# Patient Record
Sex: Female | Born: 1965
Health system: Southern US, Community
[De-identification: ages and names within clinical notes are randomized; demographics above are authoritative.]

## PROBLEM LIST (undated history)

## (undated) DIAGNOSIS — M549 Dorsalgia, unspecified: Secondary | ICD-10-CM

## (undated) DIAGNOSIS — Z87898 Personal history of other specified conditions: Secondary | ICD-10-CM

## (undated) DIAGNOSIS — K048 Radicular cyst: Secondary | ICD-10-CM

## (undated) DIAGNOSIS — G629 Polyneuropathy, unspecified: Secondary | ICD-10-CM

## (undated) DIAGNOSIS — K219 Gastro-esophageal reflux disease without esophagitis: Secondary | ICD-10-CM

## (undated) DIAGNOSIS — R112 Nausea with vomiting, unspecified: Secondary | ICD-10-CM

## (undated) DIAGNOSIS — M1990 Unspecified osteoarthritis, unspecified site: Secondary | ICD-10-CM

## (undated) DIAGNOSIS — J302 Other seasonal allergic rhinitis: Secondary | ICD-10-CM

## (undated) DIAGNOSIS — E119 Type 2 diabetes mellitus without complications: Secondary | ICD-10-CM

## (undated) DIAGNOSIS — M5030 Other cervical disc degeneration, unspecified cervical region: Secondary | ICD-10-CM

## (undated) DIAGNOSIS — Z9889 Other specified postprocedural states: Secondary | ICD-10-CM

## (undated) DIAGNOSIS — E782 Mixed hyperlipidemia: Secondary | ICD-10-CM

## (undated) DIAGNOSIS — Z973 Presence of spectacles and contact lenses: Secondary | ICD-10-CM

## (undated) DIAGNOSIS — N200 Calculus of kidney: Secondary | ICD-10-CM

## (undated) DIAGNOSIS — D509 Iron deficiency anemia, unspecified: Secondary | ICD-10-CM

## (undated) DIAGNOSIS — I10 Essential (primary) hypertension: Secondary | ICD-10-CM

## (undated) DIAGNOSIS — N879 Dysplasia of cervix uteri, unspecified: Secondary | ICD-10-CM

## (undated) DIAGNOSIS — F909 Attention-deficit hyperactivity disorder, unspecified type: Secondary | ICD-10-CM

## (undated) DIAGNOSIS — G8929 Other chronic pain: Secondary | ICD-10-CM

## (undated) HISTORY — DX: Other cervical disc degeneration, unspecified cervical region: M50.30

## (undated) HISTORY — PX: KNEE SURGERY: SHX244

## (undated) HISTORY — DX: Dorsalgia, unspecified: M54.9

## (undated) HISTORY — PX: TRANSTHORACIC ECHOCARDIOGRAM: SHX275

## (undated) HISTORY — PX: CARDIOVASCULAR STRESS TEST: SHX262

## (undated) HISTORY — DX: Polyneuropathy, unspecified: G62.9

## (undated) HISTORY — DX: Other chronic pain: G89.29

## (undated) HISTORY — DX: Attention-deficit hyperactivity disorder, unspecified type: F90.9

## (undated) HISTORY — DX: Radicular cyst: K04.8

---
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
## 1998-05-06 HISTORY — PX: LAPAROSCOPIC CHOLECYSTECTOMY: SUR755

## 1998-08-16 ENCOUNTER — Emergency Department (HOSPITAL_COMMUNITY): Admission: EM | Admit: 1998-08-16 | Discharge: 1998-08-16 | Payer: Self-pay | Admitting: Emergency Medicine

## 1998-08-29 ENCOUNTER — Encounter: Payer: Self-pay | Admitting: Internal Medicine

## 1998-08-29 ENCOUNTER — Ambulatory Visit (HOSPITAL_COMMUNITY): Admission: RE | Admit: 1998-08-29 | Discharge: 1998-08-29 | Payer: Self-pay | Admitting: Internal Medicine

## 1999-05-21 ENCOUNTER — Encounter: Admission: RE | Admit: 1999-05-21 | Discharge: 1999-08-19 | Payer: Self-pay | Admitting: Endocrinology

## 1999-10-05 ENCOUNTER — Other Ambulatory Visit: Admission: RE | Admit: 1999-10-05 | Discharge: 1999-10-05 | Payer: Self-pay | Admitting: Obstetrics and Gynecology

## 1999-11-19 ENCOUNTER — Encounter: Payer: Self-pay | Admitting: Obstetrics and Gynecology

## 1999-11-19 ENCOUNTER — Ambulatory Visit (HOSPITAL_COMMUNITY): Admission: RE | Admit: 1999-11-19 | Discharge: 1999-11-19 | Payer: Self-pay | Admitting: Obstetrics and Gynecology

## 1999-12-23 ENCOUNTER — Inpatient Hospital Stay (HOSPITAL_COMMUNITY): Admission: AD | Admit: 1999-12-23 | Discharge: 1999-12-23 | Payer: Self-pay | Admitting: Obstetrics and Gynecology

## 2000-01-29 ENCOUNTER — Encounter: Payer: Self-pay | Admitting: Obstetrics & Gynecology

## 2000-01-29 ENCOUNTER — Ambulatory Visit (HOSPITAL_COMMUNITY): Admission: RE | Admit: 2000-01-29 | Discharge: 2000-01-29 | Payer: Self-pay | Admitting: Obstetrics & Gynecology

## 2000-02-28 ENCOUNTER — Ambulatory Visit (HOSPITAL_COMMUNITY): Admission: RE | Admit: 2000-02-28 | Discharge: 2000-02-28 | Payer: Self-pay | Admitting: Obstetrics & Gynecology

## 2000-02-28 ENCOUNTER — Encounter: Payer: Self-pay | Admitting: Obstetrics & Gynecology

## 2000-03-18 ENCOUNTER — Encounter (HOSPITAL_COMMUNITY): Admission: RE | Admit: 2000-03-18 | Discharge: 2000-04-09 | Payer: Self-pay | Admitting: Obstetrics and Gynecology

## 2000-03-28 ENCOUNTER — Encounter: Payer: Self-pay | Admitting: Obstetrics and Gynecology

## 2000-04-08 ENCOUNTER — Observation Stay (HOSPITAL_COMMUNITY): Admission: AD | Admit: 2000-04-08 | Discharge: 2000-04-09 | Payer: Self-pay | Admitting: Obstetrics and Gynecology

## 2000-04-11 ENCOUNTER — Encounter (HOSPITAL_COMMUNITY): Admission: RE | Admit: 2000-04-11 | Discharge: 2000-04-21 | Payer: Self-pay | Admitting: *Deleted

## 2000-04-18 ENCOUNTER — Encounter (INDEPENDENT_AMBULATORY_CARE_PROVIDER_SITE_OTHER): Payer: Self-pay | Admitting: Specialist

## 2000-04-18 ENCOUNTER — Inpatient Hospital Stay (HOSPITAL_COMMUNITY): Admission: AD | Admit: 2000-04-18 | Discharge: 2000-04-21 | Payer: Self-pay | Admitting: Obstetrics and Gynecology

## 2000-04-22 ENCOUNTER — Encounter: Admission: RE | Admit: 2000-04-22 | Discharge: 2000-07-21 | Payer: Self-pay | Admitting: Obstetrics and Gynecology

## 2000-08-21 ENCOUNTER — Encounter: Admission: RE | Admit: 2000-08-21 | Discharge: 2000-09-20 | Payer: Self-pay | Admitting: Obstetrics and Gynecology

## 2000-10-21 ENCOUNTER — Encounter: Admission: RE | Admit: 2000-10-21 | Discharge: 2000-11-20 | Payer: Self-pay | Admitting: Obstetrics and Gynecology

## 2001-05-06 HISTORY — PX: ANKLE FUSION: SHX881

## 2001-07-01 ENCOUNTER — Emergency Department (HOSPITAL_COMMUNITY): Admission: EM | Admit: 2001-07-01 | Discharge: 2001-07-01 | Payer: Self-pay | Admitting: *Deleted

## 2002-05-06 HISTORY — PX: ROTATOR CUFF REPAIR: SHX139

## 2004-09-03 ENCOUNTER — Encounter: Admission: RE | Admit: 2004-09-03 | Discharge: 2004-09-03 | Payer: Self-pay | Admitting: Family Medicine

## 2004-09-06 ENCOUNTER — Encounter: Admission: RE | Admit: 2004-09-06 | Discharge: 2004-09-06 | Payer: Self-pay | Admitting: Family Medicine

## 2004-09-20 ENCOUNTER — Other Ambulatory Visit: Admission: RE | Admit: 2004-09-20 | Discharge: 2004-09-20 | Payer: Self-pay | Admitting: Family Medicine

## 2005-03-12 ENCOUNTER — Encounter: Admission: RE | Admit: 2005-03-12 | Discharge: 2005-03-12 | Payer: Self-pay | Admitting: Family Medicine

## 2006-03-11 ENCOUNTER — Other Ambulatory Visit: Admission: RE | Admit: 2006-03-11 | Discharge: 2006-03-11 | Payer: Self-pay | Admitting: Family Medicine

## 2007-09-27 ENCOUNTER — Emergency Department (HOSPITAL_COMMUNITY): Admission: EM | Admit: 2007-09-27 | Discharge: 2007-09-27 | Payer: Self-pay | Admitting: Emergency Medicine

## 2007-10-20 ENCOUNTER — Encounter: Admission: RE | Admit: 2007-10-20 | Discharge: 2007-10-20 | Payer: Self-pay | Admitting: General Surgery

## 2007-11-21 ENCOUNTER — Emergency Department (HOSPITAL_COMMUNITY): Admission: EM | Admit: 2007-11-21 | Discharge: 2007-11-21 | Payer: Self-pay | Admitting: Emergency Medicine

## 2008-03-04 ENCOUNTER — Emergency Department (HOSPITAL_COMMUNITY): Admission: EM | Admit: 2008-03-04 | Discharge: 2008-03-04 | Payer: Self-pay | Admitting: Emergency Medicine

## 2008-07-14 ENCOUNTER — Ambulatory Visit: Payer: Self-pay | Admitting: Vascular Surgery

## 2008-07-14 ENCOUNTER — Emergency Department (HOSPITAL_COMMUNITY): Admission: EM | Admit: 2008-07-14 | Discharge: 2008-07-14 | Payer: Self-pay | Admitting: Emergency Medicine

## 2008-07-14 ENCOUNTER — Encounter (INDEPENDENT_AMBULATORY_CARE_PROVIDER_SITE_OTHER): Payer: Self-pay | Admitting: Emergency Medicine

## 2008-10-10 IMAGING — CT CT ABDOMEN W/O CM
2 of 4 series · 16 of 46 positions shown, 18 images · non-contrast
Comparison: 09/03/2004

CT ABDOMEN

CLINICAL DATA: Right flank pain.

CT ABDOMEN AND PELVIS WITHOUT CONTRAST
TECHNIQUE: Multidetector CT imaging of the abdomen and pelvis was
performed following the standard
protocol without intravenous contrast.

[Series 2: stone_wo 5.0 b40f st · axial · 0.81mm/px · z∈[-522,-66]mm · 13 of 126 slices shown, 15 images]
[im 6/126  soft-tissue]
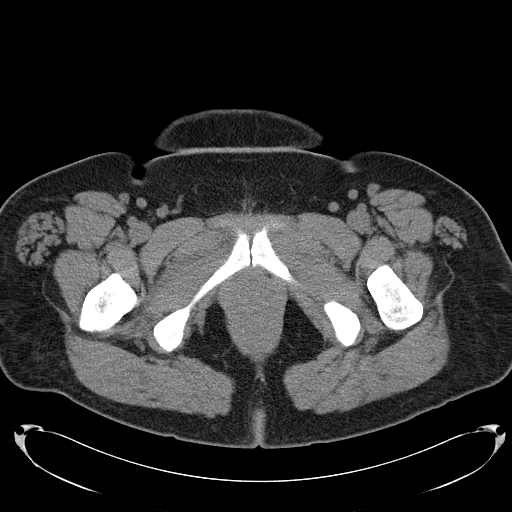
[im 6/126  bone]
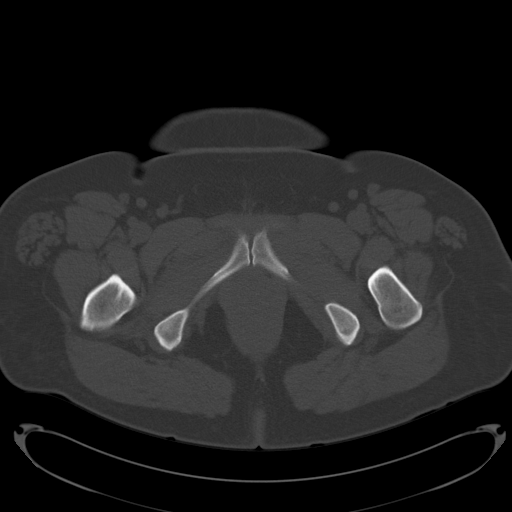
[im 16/126  soft-tissue]
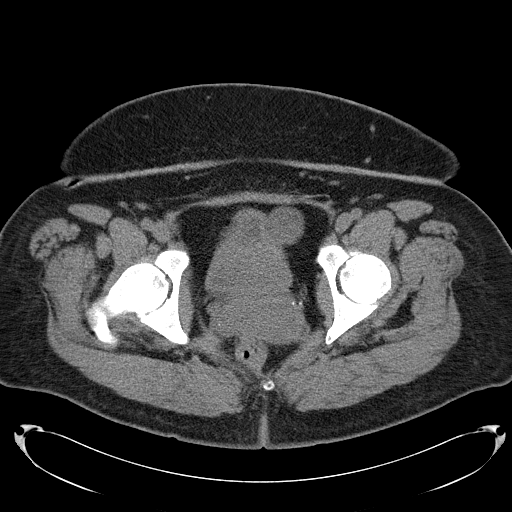
[im 27/126  soft-tissue]
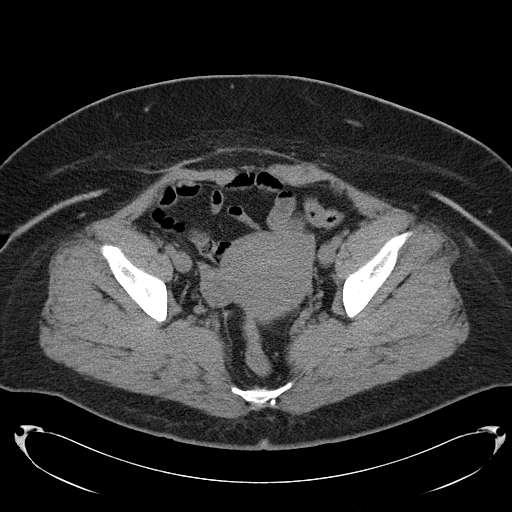
[im 37/126  soft-tissue]
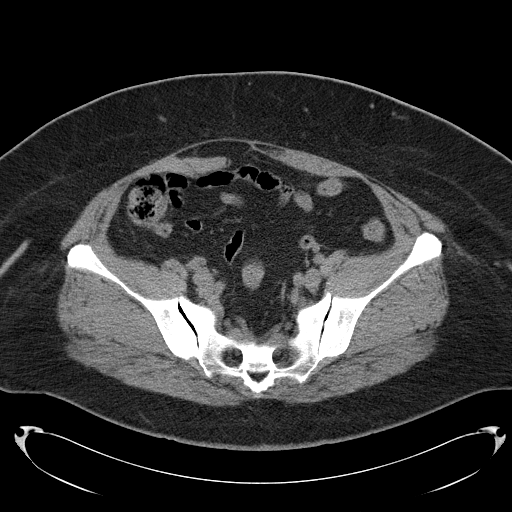
[im 42/126  soft-tissue]
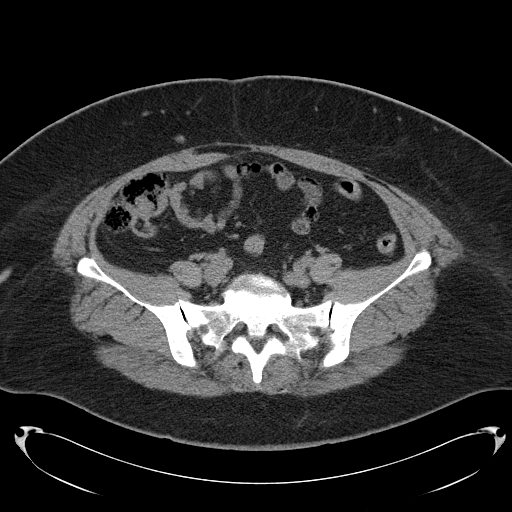
[im 53/126  soft-tissue]
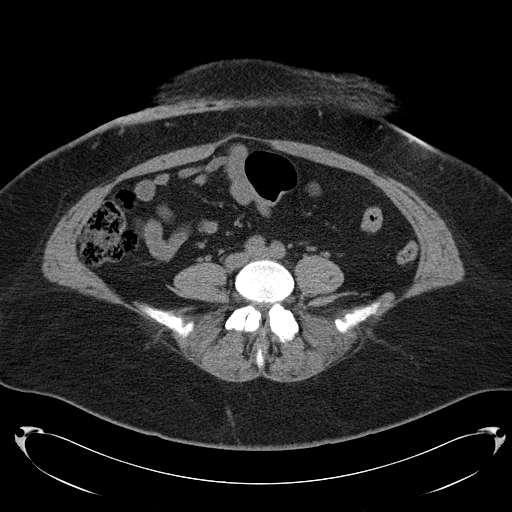
[im 63/126  soft-tissue]
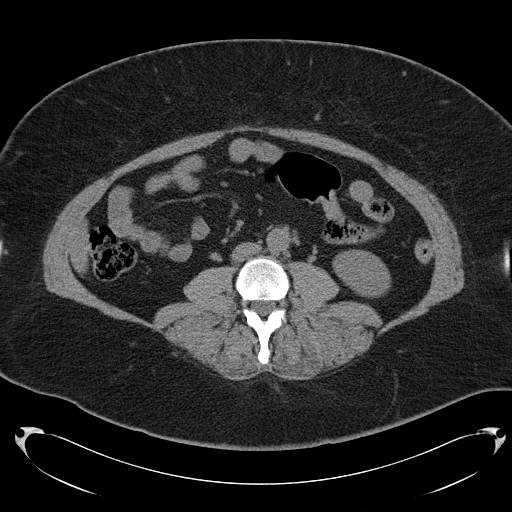
[im 73/126  soft-tissue]
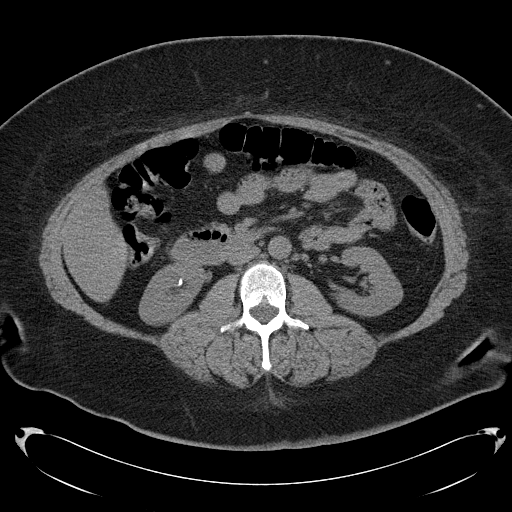
[im 84/126  soft-tissue]
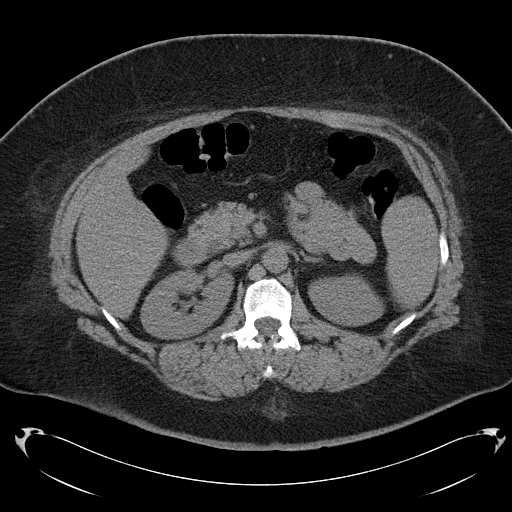
[im 84/126  bone]
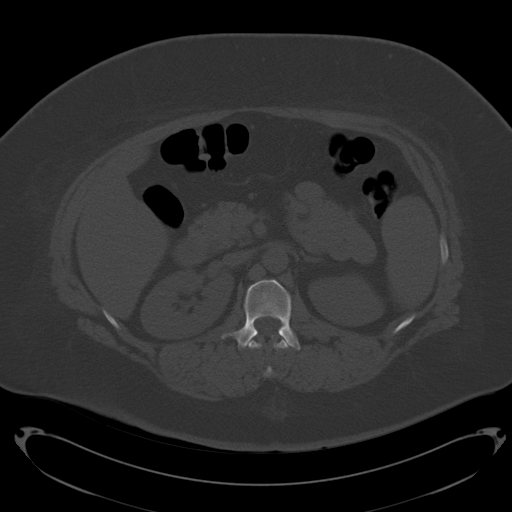
[im 89/126  soft-tissue]
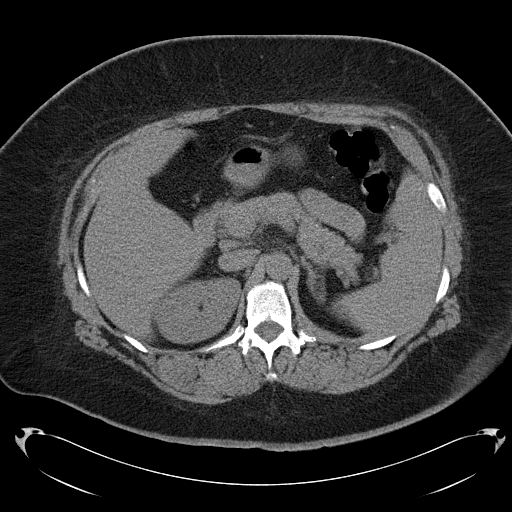
[im 99/126  soft-tissue]
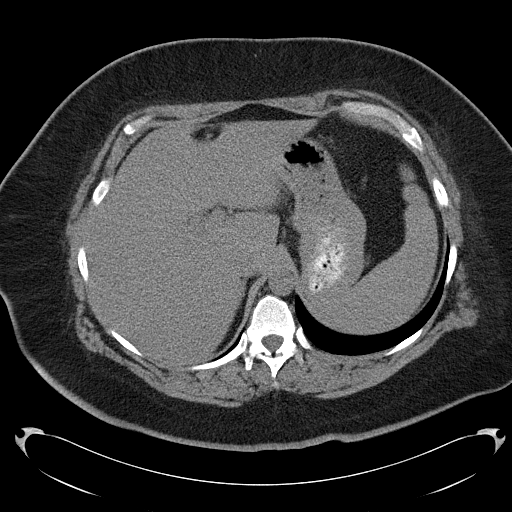
[im 110/126  soft-tissue]
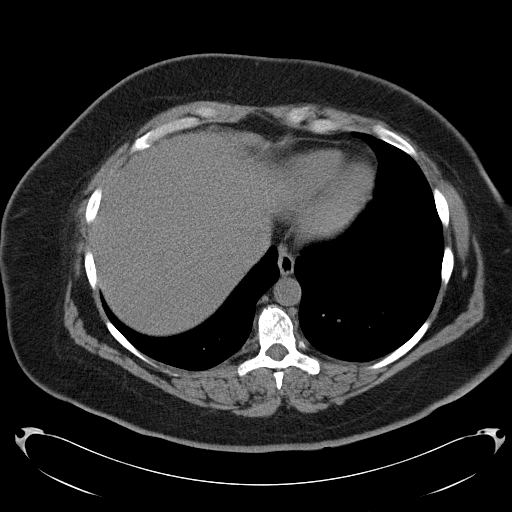
[im 120/126  soft-tissue]
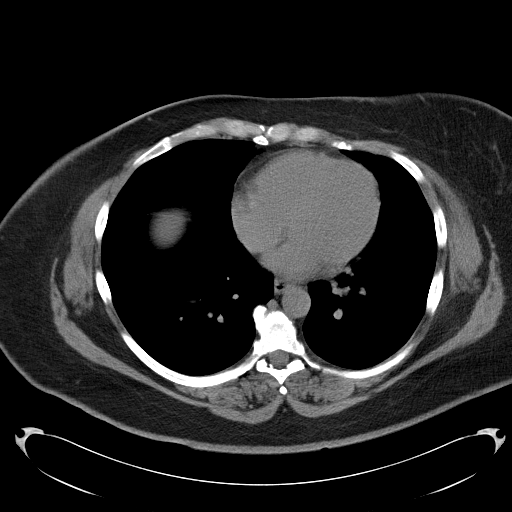

[Series 602: coronal abdomen · coronal · 1.02mm/px · 3 of 120 slices shown]
[im 40/120  soft-tissue]
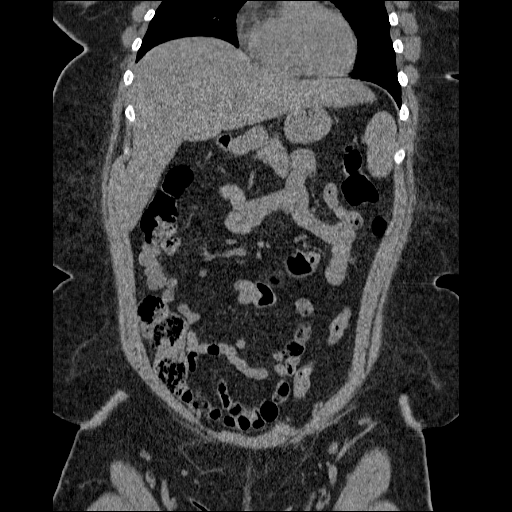
[im 53/120  soft-tissue]
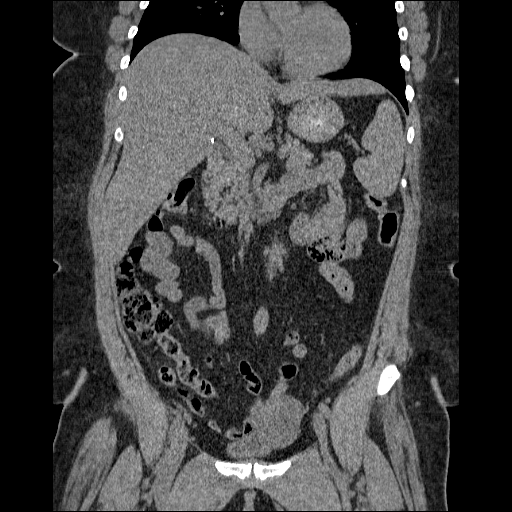
[im 67/120  soft-tissue]
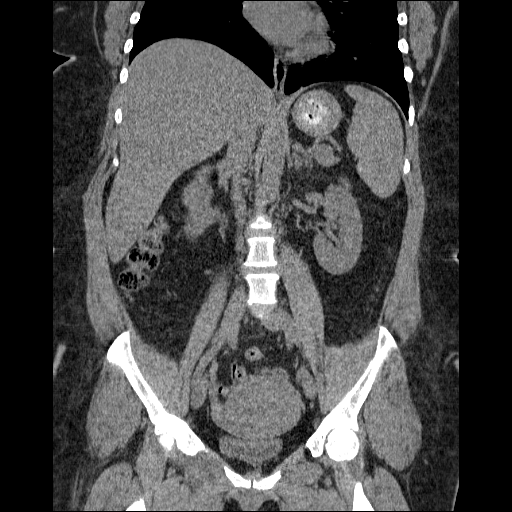

[16 of 46 positions shown; findings below may reference images not displayed]

FINDINGS: Lung bases clear.  Mild lower thoracic spondylosis.
Cholecystectomy.  Noncontrast technique gives limited evaluation of
solid and hollow abdominal viscera.  No left renal stones.
Interpolar to lower polar 2 cm low density exophytic renal lesion
is present, incompletely characterized but likely representing
simple cyst.  Right kidney shows 7 x 3 mm inferior pole
nonobstructing collecting system calculus.  4 mm x 2 mm interpolar
nonobstructing collecting system calculus.  Both ureters appear
within normal limits, without ureteral calculi.  Calcified
phleboliths are noted in the anatomic pelvis. Fatty liver has
improved compared to the prior exam. The left interpolar cystic
lesion with little change from the prior exam, consistent with
benign etiology.
IMPRESSION: 1.  2 Nonobstructing right renal collecting system calculi, with
the larger stone measuring 7 mm x 3 mm in the right inferior pole
collecting system.
2.  Cholecystectomy.
3.  On the prior exam, the interpolar right renal collecting system
stone was smaller, representing a slight increase in stone
activity.

CT PELVIS
FINDINGS: Urinary bladder appears within normal limits.  Left
adnexal cystic lesion is present likely representing left ovarian
cysts measuring 3.7 x 2.6 cm.  The right adnexa appear within
normal limits.  The appendix and colon appear normal.  No free
fluid or free air.  No lymphadenopathy. Lower lumbar spondylosis,
most pronounced at L5-S1. Diastasis of the rectus muscles is
present, with a small fat containing ventral hernia.  Severe
bilateral SI joint degenerative disease.  Benign appearing bone
island present and the medial left iliac bone.
IMPRESSION: No acute pelvic abnormality.  3.7 cm left ovarian cyst.
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
## 2010-09-21 NOTE — Op Note (Signed)
[facility]  Patient:    Miranda Lee, Miranda Lee                    MRN: [id number] Proc. Date: 04/18/00 Adm. Date:  [id number] Attending:  Leonard Schwartz                           Operative Report  PREOPERATIVE DIAGNOSES:       1. Term intrauterine pregnancy.                               2. Insulin-dependent diabetes mellitus.                               3. Desires sterilization.                               4. Suspected macrosomia.  POSTOPERATIVE DIAGNOSES:      1. Term intrauterine pregnancy.                               2. Insulin-dependent diabetes mellitus.                               3. Desires sterilization.                               4. Suspected macrosomia.  PROCEDURE:                    1. Primary low transverse cesarean section.                               2. Modified [place] bilateral tubal ligation.  SURGEON:                      Janine Limbo, M.D.  ASSISTANT:                    Miguel Dibble, C.N.M.  ANESTHESIA:                   Spinal.  INDICATIONS:                  Miranda Lee is a 45 year old female, gravida 3, para 2-0-0-2, who presents at [redacted] weeks gestation for a primary cesarean delivery.  The patient has had progressively larger infants.  An ultrasound was done at 35 weeks that showed that the patient was in the 97th percentile. The patient desires a permanent sterilization.  We discussed the risks and the benefits associated with a vaginal birth.  We also discussed the risks and benefits of a cesarean delivery.  After carefully considering her options, the patient elected to proceed with a primary cesarean delivery.  The patient also desires a permanent sterilization.  She understands the indications for her procedure and she accepts the risks of, but not limited to, anesthetic complications, bleeding, infection, and possible damage to the surrounding organs.  She understands that there is a small  but real failure rate associated with this procedure (  10-17/1000).  FINDINGS:                     A 9 pound, 7 ounce female infant (Abigail), was delivered with Apgars of 8 at one minute and 9 at five minutes.  The fallopian tubes, ovaries, and the uterus were normal.  DESCRIPTION OF PROCEDURE:     The patient was taken to the operating room where a spinal anesthetic was given.  The patients abdomen and perineum were prepped with multiple layers of Betadine.  A Foley catheter was placed.  The patient was then sterilely draped.  A low transverse incision was made in the abdomen and carried sharply through the subcutaneous tissues, the fascia, and the anterior peritoneum.  An incision was made in the lower uterine segment and extended transversely.  The fetal head was delivered.  The mouth and nose were suctioned.  The remainder of the infant was delivered.  The cord was clamped and cut.  The infant was handed to the awaiting pediatric team. Routine cord blood studies were obtained.  The placenta was removed.  The uterine cavity was cleaned of amniotic fluid and clotted blood.  The uterine incision was closed using a running locking suture of #2-0 Vicryl.  Hemostasis was achieved using figure-of-eight sutures of #2-0 Vicryl.  The left fallopian tube was identified and followed to its fimbriated end.  A knuckle of tube was made on the left using a free tie, and then a tie of suture ligature of #0 plain catgut.  The knuckle of tube thus made was sharply excised.  Hemostasis was adequate.  The cut ends of the tube were cauterized.  An identical procedure was carried out on the opposite side.  Again hemostasis was adequate.  The peritoneal cavity was irrigated.  All instruments were removed. The abdominal musculature and the fat and the anterior peritoneum were reapproximated in the midline using a figure-of-eight suture of #2-0 Vicryl. The fascia was irrigated and then the fascia was closed  using a running suture of #0 Vicryl, followed by three interrupted sutures of #0 Vicryl.  The subcutaneous layer was irrigated and hemostasis was adequate.  The subcutaneous layer was closed using figure-of-eight sutures of#2-0 Vicryl. The skin was reapproximated using skin staples.  The sponge, needle, and instrument counts were correct on two occasions.  The estimated blood loss for the procedure was 700 cc.  The patient tolerated her procedure well.  She was awakened from her anesthetic and taken to the recovery room in stable condition.  The infant was taken to the full-term nursing in stable condition. DD:  04/18/00 TD:  04/19/00 Job: [id number]

## 2010-09-21 NOTE — H&P (Signed)
[facility]  Patient:    Miranda Lee, Miranda Lee                    MRN: [id number] Adm. Date:  [id number] Attending:  Michaelle Copas                         History and Physical  No dictation. DD:  04/17/00 TD:  04/18/00 Job: [id number]

## 2010-09-21 NOTE — H&P (Signed)
[facility]  Patient:    Miranda Lee, Miranda Lee                    MRN: [id number] Adm. Date:  [id number] Attending:  Michaelle Copas                         History and Physical  HISTORY OF PRESENT ILLNESS:   Miranda Lee is a 45 year old female, gravida 3, para 2-0-0-2, who presents at [redacted] weeks gestation [facility] May 02, 2000) for cesarean delivery.  The patient has been followed at [facility] for this pregnancy that has been complicated by insulin-requiring diabetes, obesity, history of postpartum depression, and a desire for tubal ligation.  Her blood sugars have been well-controlled.  An ultrasound was obtained on March 18, 2000 at which time, macrosomia was noted.  The infant weighed greater than the 97th percentile.  PAST MEDICAL HISTORY:         The patient has diabetes and she is currently taking 10 units of Humulin insulin and 7 units of NPH insulin in the morning. She takes 20 units of NPH insulin at bedtime.  The patient has a history of postpartum depression.  She is obese.  DRUG ALLERGIES:               TETRACYCLINE.  OBSTETRICAL HISTORY:          The patient had a vaginal delivery in 1989 of a 7 pound 13 ounce female infant.  In 1996, she had a vaginal delivery of an 8 pound 13 ounce female infant.  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 She denies cigarette use, alcohol use and recreational drug use.  REVIEW OF SYSTEMS:            Normal pregnancy complaints.  FAMILY HISTORY:               Noncontributory.  PHYSICAL EXAMINATION:         Weight is 280 pounds.  HEENT:                        Within normal limits.  CHEST:                        Clear.  HEART:                        Regular rate and rhythm.  BREASTS:                      Without masses.  ABDOMEN:                      Gravid with a fundal height of greater than        40 cm.  EXTREMITIES:                  Within normal limits.  NEUROLOGIC EXAMINATION:       Normal.  CERVIX:                       Closed and long.  LABORATORY VALUES:            Blood type is O-positive, antibody screen negative.  VDRL nonreactive.  Rubella immune.  HBsAg negative.  Third trimester beta strep was negative.  ASSESSMENT:                   1. A 38-week gestation.                               2. Insulin-requiring diabetes.                               3. Macrosomia.                               4. Desire for primary cesarean delivery.                               5. Desire for sterilization.  PLAN:                         The risks and benefits of a vaginal delivery as it relates to diabetes and macrosomia were reviewed with the patient.  We also discussed the risks and benefits of primary cesarean delivery.  After carefully considering her options and considering the possible risk to her infant associated with a vaginal delivery, the patient elects to proceed with primary cesarean delivery.  She also desires sterilization.  She understands the risks associated with her procedure, which include, but are not limited to, anesthetic complications, bleeding, infections, possible damage to surrounding organs, and possible tubal failure (10-17 per 1000). DD:  04/17/00 TD:  04/17/00 Job: [id number]

## 2010-09-21 NOTE — Discharge Summary (Signed)
[facility]  Patient:    IRISHA, GRANDMAISON                    MRN: [id number] Adm. Date:  [id number] Disc. Date: [id number] Attending:  Michaelle Copas Dictator:   Miguel Dibble, C.N.M.                           Discharge Summary  DATE OF BIRTH:                1965/12/22  ADMISSION DIAGNOSES:          Intrauterine pregnancy at term, insulin-dependent diabetes mellitus, macrosomia, multiparous desiring permanent sterilization.  DISCHARGE DIAGNOSES:          Intrauterine pregnancy at term, insulin-dependent diabetes mellitus, macrosomia, multiparous desiring permanent sterilization, delivered by primary cesarean section, viable baby girl Apgars 8 and 9; weight 9 pounds 7 ounces, recovered from bilateral tubal ligation.  PROCEDURE:                    Spinal anesthesia.  HOSPITAL COURSE:              On December 14 Ekta Dancer was admitted for a planned primary cesarean section for history of macrosomia.  She also desired permanent sterilization.  She delivered by lower segment transverse cesarean section a viable baby girl, Abigail, Apgars 8 and 9; weight 9 pounds 7 ounces and also had a bilateral tubal ligation.  She had uncomplicated surgery.  Postoperative day #1 on the 15th she was up at lib, breast-feeding satisfactorily.  Ms. Ibe resumed her previous insulin orders of Humulin 6 units and NPH 4 units q.a.m. and at bedtime 10 units of NPH which appeared to be satisfactory for her hospital stay.  On postoperative day #1 she was also visited by a Child psychotherapist due to a previous history of postpartum depression. She was evaluated as having feeling comfortable and capable of caring for herself and her baby and would have significant help at home.  Hemoglobin had dropped to 10.5.  Fasting blood sugar was 96.  Her chest was clear.  Abdomen: Soft, nontender.  Her two hour postprandial was 117.  Her incision was clean, dry, and intact  and healing well.  Lochia was small.  On postoperative day #2, the 16th, she continued to recover satisfactorily.  She is afebrile.  Incision was clean, dry, and intact.  She continued with her current insulin dosage. On postoperative day #3, the 17th, she is breast-feeding well.  She apparently had bruised her right big toe in the shower when her bottle of shampoo fell on it; however, it did not appear to be fractured and was slightly tender to the touch.  She had equal pedal edema in both feet.  Her incision was clean, dry, and intact.  She had a pendulous abdomen and it was felt necessary to delay discontinuing staples until an office visit on the 20th.  She experienced some degree of bloating and although she was passing flatus initially, was still feeling uncomfortable with gas pain on the 17th.  She was given Dulcolax suppositories and Milk of Magnesia with good results.  She was discharged home in stable condition deeming to have received the full benefit of her hospitalization.  DISCHARGE INSTRUCTIONS:       Per [facility] OB/[facility]N booklet.  DISCHARGE MEDICATIONS:        Motrin, Tylox, prenatal  vitamins, insulin.  DISCHARGE FOLLOW-UP:          On December 20 for staple removal.  Discharge follow-up in six weeks for postpartum check. DD:  04/21/00 TD:  04/21/00 Job: [id number]

## 2011-02-01 LAB — POCT I-STAT, CHEM 8
BUN: 8
Calcium, Ion: 1.14
Chloride: 102
Creatinine, Ser: 0.8
Glucose, Bld: 174 — ABNORMAL HIGH
HCT: 43
Hemoglobin: 14.6
Potassium: 3.6
Sodium: 139
TCO2: 27

## 2011-02-01 LAB — CBC
HCT: 41
Hemoglobin: 13.8
MCHC: 33.6
MCV: 79.8
Platelets: 301
RBC: 5.14 — ABNORMAL HIGH
RDW: 14.4
WBC: 11 — ABNORMAL HIGH

## 2011-02-01 LAB — URINALYSIS, ROUTINE W REFLEX MICROSCOPIC
Bilirubin Urine: NEGATIVE
Glucose, UA: NEGATIVE
Hgb urine dipstick: NEGATIVE
Ketones, ur: NEGATIVE
Nitrite: NEGATIVE
Protein, ur: 30 — AB
Specific Gravity, Urine: 1.023
Urobilinogen, UA: 0.2
pH: 6

## 2011-02-01 LAB — DIFFERENTIAL
Basophils Absolute: 0.1
Basophils Relative: 1
Eosinophils Absolute: 0.2
Eosinophils Relative: 2
Lymphocytes Relative: 26
Lymphs Abs: 2.8
Monocytes Absolute: 0.3
Monocytes Relative: 3
Neutro Abs: 7.6
Neutrophils Relative %: 69

## 2011-02-01 LAB — URINE MICROSCOPIC-ADD ON

## 2016-08-04 DIAGNOSIS — N200 Calculus of kidney: Secondary | ICD-10-CM

## 2016-08-04 HISTORY — DX: Calculus of kidney: N20.0

## 2016-08-15 ENCOUNTER — Emergency Department (HOSPITAL_BASED_OUTPATIENT_CLINIC_OR_DEPARTMENT_OTHER): Payer: BLUE CROSS/BLUE SHIELD

## 2016-08-15 ENCOUNTER — Emergency Department (HOSPITAL_BASED_OUTPATIENT_CLINIC_OR_DEPARTMENT_OTHER)
Admission: EM | Admit: 2016-08-15 | Discharge: 2016-08-15 | Disposition: A | Discharge status: Home / Self Care | Payer: BLUE CROSS/BLUE SHIELD | Attending: Emergency Medicine | Admitting: Emergency Medicine

## 2016-08-15 ENCOUNTER — Encounter (HOSPITAL_BASED_OUTPATIENT_CLINIC_OR_DEPARTMENT_OTHER): Payer: Self-pay | Admitting: *Deleted

## 2016-08-15 DIAGNOSIS — Z7984 Long term (current) use of oral hypoglycemic drugs: Secondary | ICD-10-CM | POA: Diagnosis not present

## 2016-08-15 DIAGNOSIS — Z79899 Other long term (current) drug therapy: Secondary | ICD-10-CM | POA: Insufficient documentation

## 2016-08-15 DIAGNOSIS — N1330 Unspecified hydronephrosis: Secondary | ICD-10-CM | POA: Insufficient documentation

## 2016-08-15 DIAGNOSIS — R109 Unspecified abdominal pain: Secondary | ICD-10-CM

## 2016-08-15 DIAGNOSIS — N200 Calculus of kidney: Secondary | ICD-10-CM

## 2016-08-15 DIAGNOSIS — I10 Essential (primary) hypertension: Secondary | ICD-10-CM | POA: Diagnosis not present

## 2016-08-15 DIAGNOSIS — E119 Type 2 diabetes mellitus without complications: Secondary | ICD-10-CM | POA: Insufficient documentation

## 2016-08-15 HISTORY — DX: Essential (primary) hypertension: I10

## 2016-08-15 LAB — COMPREHENSIVE METABOLIC PANEL
ALT: 19 U/L (ref 14–54)
AST: 26 U/L (ref 15–41)
Albumin: 3.7 g/dL (ref 3.5–5.0)
Alkaline Phosphatase: 66 U/L (ref 38–126)
Anion gap: 9 (ref 5–15)
BUN: 19 mg/dL (ref 6–20)
CO2: 28 mmol/L (ref 22–32)
Calcium: 9.8 mg/dL (ref 8.9–10.3)
Chloride: 99 mmol/L — ABNORMAL LOW (ref 101–111)
Creatinine, Ser: 1.12 mg/dL — ABNORMAL HIGH (ref 0.44–1.00)
GFR calc Af Amer: >60 mL/min (ref >60)
GFR calc non Af Amer: 57 mL/min — ABNORMAL LOW (ref >60)
Glucose, Bld: 95 mg/dL (ref 65–99)
Potassium: 3.8 mmol/L (ref 3.5–5.1)
Sodium: 136 mmol/L (ref 135–145)
Total Bilirubin: 0.3 mg/dL (ref 0.3–1.2)
Total Protein: 7.3 g/dL (ref 6.5–8.1)

## 2016-08-15 LAB — CBC WITH DIFFERENTIAL/PLATELET
Basophils Absolute: 0 10*3/uL (ref 0.0–0.1)
Basophils Relative: 0 %
Eosinophils Absolute: 0.2 10*3/uL (ref 0.0–0.7)
Eosinophils Relative: 1 %
HCT: 38.5 % (ref 36.0–46.0)
Hemoglobin: 13.1 g/dL (ref 12.0–15.0)
Lymphocytes Relative: 21 %
Lymphs Abs: 2.7 10*3/uL (ref 0.7–4.0)
MCH: 28.2 pg (ref 26.0–34.0)
MCHC: 34 g/dL (ref 30.0–36.0)
MCV: 82.8 fL (ref 78.0–100.0)
Monocytes Absolute: 0.9 10*3/uL (ref 0.1–1.0)
Monocytes Relative: 7 %
Neutro Abs: 8.9 10*3/uL — ABNORMAL HIGH (ref 1.7–7.7)
Neutrophils Relative %: 71 %
Platelets: 271 10*3/uL (ref 150–400)
RBC: 4.65 MIL/uL (ref 3.87–5.11)
RDW: 14.8 % (ref 11.5–15.5)
WBC: 12.7 10*3/uL — ABNORMAL HIGH (ref 4.0–10.5)

## 2016-08-15 LAB — URINALYSIS, ROUTINE W REFLEX MICROSCOPIC
Bilirubin Urine: NEGATIVE
Glucose, UA: NEGATIVE mg/dL
Ketones, ur: NEGATIVE mg/dL
Leukocytes, UA: NEGATIVE
Nitrite: NEGATIVE
Protein, ur: NEGATIVE mg/dL
Specific Gravity, Urine: 1.008 (ref 1.005–1.030)
pH: 5.5 (ref 5.0–8.0)

## 2016-08-15 LAB — URINALYSIS, MICROSCOPIC (REFLEX): WBC, UA: NONE SEEN WBC/hpf (ref 0–5)

## 2016-08-15 LAB — PREGNANCY, URINE: Preg Test, Ur: NEGATIVE

## 2016-08-15 LAB — LIPASE, BLOOD: Lipase: 42 U/L (ref 11–51)

## 2016-08-15 MED ORDER — SODIUM CHLORIDE 0.9 % IV BOLUS (SEPSIS)
1000.0000 mL | Freq: Once | INTRAVENOUS | Status: AC
Start: 2016-08-15 — End: 2016-08-15
  Administered 2016-08-15: 1000 mL via INTRAVENOUS

## 2016-08-15 MED ORDER — KETOROLAC TROMETHAMINE 30 MG/ML IJ SOLN
30.0000 mg | Freq: Once | INTRAMUSCULAR | Status: AC
  Administered 2016-08-15: 30 mg via INTRAVENOUS
  Filled 2016-08-15: qty 1

## 2016-08-15 MED ORDER — MORPHINE SULFATE (PF) 4 MG/ML IV SOLN
4.0000 mg | Freq: Once | INTRAVENOUS | Status: AC
  Administered 2016-08-15: 4 mg via INTRAVENOUS
  Filled 2016-08-15: qty 1

## 2016-08-15 MED ORDER — ONDANSETRON HCL 4 MG/2ML IJ SOLN
4.0000 mg | Freq: Once | INTRAMUSCULAR | Status: AC
  Administered 2016-08-15: 4 mg via INTRAVENOUS
  Filled 2016-08-15: qty 2

## 2016-08-15 MED ORDER — NAPROXEN 500 MG PO TABS: 500.0000 mg | ORAL_TABLET | Freq: Two times a day (BID) | ORAL | 0 refills | Status: DC

## 2016-08-15 MED ORDER — OXYCODONE-ACETAMINOPHEN 5-325 MG PO TABS: 2.0000 | ORAL_TABLET | ORAL | 0 refills | Status: DC | PRN

## 2016-08-15 MED ORDER — TAMSULOSIN HCL 0.4 MG PO CAPS: 0.4000 mg | ORAL_CAPSULE | Freq: Every day | ORAL | 0 refills | Status: AC

## 2016-08-15 MED ORDER — ONDANSETRON 4 MG PO TBDP: 4.0000 mg | ORAL_TABLET | Freq: Three times a day (TID) | ORAL | 0 refills | Status: DC | PRN

## 2016-08-15 MED FILL — TAMSULOSIN HCL 0.4 MG CAP: 0.4 | 14 days supply | Qty: 14 | Fill #0

## 2016-08-15 MED FILL — OXYCODONE/APAP 5/325 MG TAB: 5-325 | 2 days supply | Qty: 20 | Fill #0

## 2016-08-15 MED FILL — ONDANSETRON ODT 4 MG TABLET: 4 | 4 days supply | Qty: 20 | Fill #0

## 2016-08-15 MED FILL — NAPROXEN 500 MG TABLET: 500 | 15 days supply | Qty: 30 | Fill #0

## 2016-08-15 NOTE — ED Provider Notes (Signed)
[facility] Provider Note   CSN: [id number] Arrival date & time: 08/15/16  4268     History   Chief Complaint Chief Complaint  Patient presents with  . Flank Pain    HPI Miranda Lee is a 51 y.o. female.  HPI  Right flank pain beginning yesterday, worsening through the night. Severe nausea and vomiting.  3-4 times emesis. Can't keep down water.  Urinary urgency, frequency today No fever 7/10 when flaring more is 10/10 waves of worsening pain  Hx of nephrolithiasis 16 yrs ago while pregnant  Past Medical History:  Diagnosis Date  . Diabetes mellitus without complication (Home)   . Hypertension   . Kidney stones     There are no active problems to display for this patient.   No past surgical history on file.  OB History    No data available       Home Medications    Prior to Admission medications   Medication Sig Start Date End Date Taking? Authorizing Provider  Celecoxib (CELEBREX PO) Take by mouth.   Yes Historical Provider, MD  DULoxetine HCl (CYMBALTA PO) Take by mouth.   Yes Historical Provider, MD  hydrochlorothiazide (HYDRODIURIL) 25 MG tablet Take 25 mg by mouth daily.   Yes Historical Provider, MD  lisinopril (PRINIVIL,ZESTRIL) 10 MG tablet Take 10 mg by mouth daily.   Yes Historical Provider, MD  methylphenidate 36 MG PO CR tablet Take 36 mg by mouth daily.   Yes Historical Provider, MD  pregabalin (LYRICA) 75 MG capsule Take 75 mg by mouth 2 (two) times daily.   Yes Historical Provider, MD  naproxen (NAPROSYN) 500 MG tablet Take 1 tablet (500 mg total) by mouth 2 (two) times daily. 08/15/16   Gareth Morgan, MD  ondansetron (ZOFRAN ODT) 4 MG disintegrating tablet Take 1-2 tablets (4-8 mg total) by mouth every 8 (eight) hours as needed for nausea or vomiting. 08/15/16   Gareth Morgan, MD  oxyCODONE-acetaminophen (PERCOCET/ROXICET) 5-325 MG tablet Take 2 tablets by mouth every 4 (four) hours as needed for severe pain. 08/15/16   Gareth Morgan, MD  tamsulosin (FLOMAX) 0.4 MG CAPS capsule Take 1 capsule (0.4 mg total) by mouth daily. 08/15/16 08/29/16  Gareth Morgan, MD    Family History No family history on file.  Social History Social History  Substance Use Topics  . Smoking status: Not on file  . Smokeless tobacco: Not on file  . Alcohol use Not on file     Allergies   Latex; Tetracyclines & related; and Vicodin [hydrocodone-acetaminophen]   Review of Systems Review of Systems  Constitutional: Negative for fever.  HENT: Negative for sore throat.   Eyes: Negative for visual disturbance.  Respiratory: Negative for cough and shortness of breath.   Cardiovascular: Negative for chest pain.  Gastrointestinal: Positive for nausea and vomiting. Negative for abdominal pain.  Genitourinary: Positive for flank pain. Negative for difficulty urinating.  Musculoskeletal: Positive for back pain. Negative for neck pain.  Skin: Negative for rash.  Neurological: Negative for syncope and headaches.     Physical Exam Updated Vital Signs BP 126/75   Pulse 84   Temp 98.2 F (36.8 C) (Oral)   Resp 16   Ht 5\' 8"  (1.727 m)   Wt 248 lb (112.5 kg)   LMP 08/03/2016   SpO2 98%   BMI 37.71 kg/m   Physical Exam  Constitutional: She is oriented to person, place, and time. She appears well-developed and well-nourished. No distress.  HENT:  Head: Normocephalic and atraumatic.  Eyes: Conjunctivae and EOM are normal.  Neck: Normal range of motion.  Cardiovascular: Normal rate, regular rhythm, normal heart sounds and intact distal pulses.  Exam reveals no gallop and no friction rub.   No murmur heard. Pulmonary/Chest: Effort normal and breath sounds normal. No respiratory distress. She has no wheezes. She has no rales.  Abdominal: Soft. She exhibits no distension. There is no tenderness. There is no guarding.  Negative murphy, no RUQ tenderness Right lateral abdominal tenderness and right CVA tenderness    Musculoskeletal: She exhibits no edema or tenderness.  Neurological: She is alert and oriented to person, place, and time.  Skin: Skin is warm and dry. No rash noted. She is not diaphoretic. No erythema.  Nursing note and vitals reviewed.    ED Treatments / Results  Labs (all labs ordered are listed, but only abnormal results are displayed) Labs Reviewed  URINALYSIS, ROUTINE W REFLEX MICROSCOPIC - Abnormal; Notable for the following:       Result Value   Hgb urine dipstick LARGE (*)    All other components within normal limits  URINALYSIS, MICROSCOPIC (REFLEX) - Abnormal; Notable for the following:    Bacteria, UA MANY (*)    Squamous Epithelial / LPF 0-5 (*)    All other components within normal limits  CBC WITH DIFFERENTIAL/PLATELET - Abnormal; Notable for the following:    WBC 12.7 (*)    Neutro Abs 8.9 (*)    All other components within normal limits  COMPREHENSIVE METABOLIC PANEL - Abnormal; Notable for the following:    Chloride 99 (*)    Creatinine, Ser 1.12 (*)    GFR calc non Af Amer 57 (*)    All other components within normal limits  PREGNANCY, URINE  LIPASE, BLOOD    EKG  EKG Interpretation None       Radiology US Renal  Result Date: 08/15/2016 CLINICAL DATA:  Right flank pain and nausea for 1 day. EXAM: RENAL / URINARY TRACT ULTRASOUND COMPLETE COMPARISON:  CT abdomen and pelvis 11/21/2007. FINDINGS: Right Kidney: Length: 14.2 cm. Moderate hydronephrosis is identified. Echogenic foci with posterior acoustic shadowing compatible with stones are identified. Largest measures 0.9 cm in the lower pole. There is small amount of fluid about the kidney. Left Kidney: Length: 12.3 cm. Echogenicity within normal limits. No mass or hydronephrosis visualized. Bladder: Appears normal for degree of bladder distention. No right ureteral jet is visualized. Left ureteral jet is identified. IMPRESSION: Moderate right hydronephrosis with stones identified in the right kidney. CT  scan abdomen and pelvis without contrast would be useful further evaluation. Electronically Signed   By: Inge Rise M.D.   On: 08/15/2016 12:26   Dg Abd 2 Views  Result Date: 08/15/2016 CLINICAL DATA:  Right flank pain EXAM: ABDOMEN - 2 VIEW COMPARISON:  08/15/2016 FINDINGS: Calcification in the projection of the right kidney measures 6 mm. There is a triangular-shaped calcific density within the right side of pelvis which is new from 11/21/2007 and 11/24/2007 which may reflect a distal ureteral calculi or bladder calculi. IMPRESSION: 1. Calcification within the right side of pelvis measures 6 mm and may reflect distal ureteral or bladder calculi. Electronically Signed   By: Kerby Moors M.D.   On: 08/15/2016 12:23    Procedures Procedures (including critical care time)  Medications Ordered in ED Medications  sodium chloride 0.9 % bolus 1,000 mL (0 mLs Intravenous Stopped 08/15/16 1219)  ketorolac (TORADOL) 30 MG/ML injection 30  mg (30 mg Intravenous Given 08/15/16 1046)  ondansetron (ZOFRAN) injection 4 mg (4 mg Intravenous Given 08/15/16 1049)  morphine 4 MG/ML injection 4 mg (4 mg Intravenous Given 08/15/16 1050)  morphine 4 MG/ML injection 4 mg (4 mg Intravenous Given 08/15/16 1321)     Initial Impression / Assessment and Plan / ED Course  I have reviewed the triage vital signs and the nursing notes.  Pertinent labs & imaging results that were available during my care of the patient were reviewed by me and considered in my medical decision making (see chart for details).     51yo female presents with concern for right flank pain.  XR shows 39mm stone, US shows moderate right sided hydronephrosis. Suspect contamination of urine, will send for culture, no leukouria  Discussed with Dr. Karsten Ro. Given rx for pain and nausea control and discharged with urology follow up.   Final Clinical Impressions(s) / ED Diagnoses   Final diagnoses:  Right flank pain  Hydronephrosis of right  kidney  Nephrolithiasis    New Prescriptions Discharge Medication List as of 08/15/2016  1:14 PM    START taking these medications   Details  naproxen (NAPROSYN) 500 MG tablet Take 1 tablet (500 mg total) by mouth 2 (two) times daily., Starting Thu 08/15/2016, Print    ondansetron (ZOFRAN ODT) 4 MG disintegrating tablet Take 1-2 tablets (4-8 mg total) by mouth every 8 (eight) hours as needed for nausea or vomiting., Starting Thu 08/15/2016, Print    oxyCODONE-acetaminophen (PERCOCET/ROXICET) 5-325 MG tablet Take 2 tablets by mouth every 4 (four) hours as needed for severe pain., Starting Thu 08/15/2016, Print    tamsulosin (FLOMAX) 0.4 MG CAPS capsule Take 1 capsule (0.4 mg total) by mouth daily., Starting Thu 08/15/2016, Until Thu 08/29/2016, Print         Gareth Morgan, MD 08/15/16 2017

## 2016-08-15 NOTE — ED Triage Notes (Signed)
Pt reports right flank pain x last night, states she feels this is her usual kidney stone presentation. Also urgency and frequency.

## 2016-11-01 ENCOUNTER — Telehealth: Payer: Self-pay

## 2016-11-01 NOTE — Telephone Encounter (Signed)
SENT NOTES TO SCHEDULING 

## 2016-11-05 ENCOUNTER — Telehealth: Payer: Self-pay | Admitting: Cardiovascular Disease

## 2016-11-05 NOTE — Telephone Encounter (Signed)
Received records from [place] for appointment on 11/08/16 with Dr Gwenlyn Found.  Records put with Dr Kennon Holter schedule for 11/08/16. lp

## 2016-11-08 ENCOUNTER — Ambulatory Visit (INDEPENDENT_AMBULATORY_CARE_PROVIDER_SITE_OTHER): Payer: BLUE CROSS/BLUE SHIELD | Admitting: Cardiovascular Disease

## 2016-11-08 ENCOUNTER — Encounter: Payer: Self-pay | Admitting: Cardiovascular Disease

## 2016-11-08 VITALS — BP 151/88 | HR 94 | Ht 68.0 in | Wt 258.0 lb

## 2016-11-08 DIAGNOSIS — R079 Chest pain, unspecified: Secondary | ICD-10-CM

## 2016-11-08 DIAGNOSIS — E785 Hyperlipidemia, unspecified: Secondary | ICD-10-CM | POA: Insufficient documentation

## 2016-11-08 DIAGNOSIS — E7800 Pure hypercholesterolemia, unspecified: Secondary | ICD-10-CM

## 2016-11-08 DIAGNOSIS — I10 Essential (primary) hypertension: Secondary | ICD-10-CM | POA: Diagnosis not present

## 2016-11-08 NOTE — Assessment & Plan Note (Signed)
History essential hypertension blood pressure is 151/88. She is on lisinopril. Continue current meds are Cardizem

## 2016-11-08 NOTE — Progress Notes (Signed)
11/08/2016 Miranda Lee   [birth year]  [id number]  Primary Physician Miranda Lee Primary Cardiologist: Miranda Harp MD Miranda Lee  HPI:  Miranda Lee is a pleasant 51 year old severely overweight divorced Caucasian female mother of 3 children and works as an Web designer in [place]. She was referred by Miranda Skeans PA-C for evaluation of new onset chest pain. She does have a history of treated hypertension, hypokalemia and diabetes. She does not smoke. She had a brother age 43 that died of a myocardial infarction. She has never had a heart attack or stroke. She apparently has lost 50 pounds. 2 weeks ago she developed chest pain just prior to going to sleep with radiation to her jaw is. This resolved spontaneously after several minutes but recurred soon thereafter. Since that time she's noticed increasing dyspnea on exertion but has had no recurrent chest pain.   Current Outpatient Prescriptions  Medication Sig Dispense Refill  . Celecoxib (CELEBREX PO) Take by mouth.    . DULoxetine HCl (CYMBALTA PO) Take by mouth.    . hydrochlorothiazide (HYDRODIURIL) 25 MG tablet Take 25 mg by mouth daily.    Marland Kitchen lisinopril (PRINIVIL,ZESTRIL) 10 MG tablet Take 10 mg by mouth daily.    . methylphenidate 36 MG PO CR tablet Take 36 mg by mouth daily.    . pregabalin (LYRICA) 75 MG capsule Take 75 mg by mouth 2 (two) times daily.     No current facility-administered medications for this visit.     Allergies  Allergen Reactions  . Sulfa Antibiotics Rash  . Latex   . Tetracyclines & Related   . Vicodin [Hydrocodone-Acetaminophen]     Social History   Social History  . Marital status: Married    Spouse name: N/A  . Number of children: N/A  . Years of education: N/A   Occupational History  . Not on file.   Social History Main Topics  . Smoking status: Never Smoker  . Smokeless tobacco: Never Used  . Alcohol use Not on file  . Drug use:  Unknown  . Sexual activity: Not on file   Other Topics Concern  . Not on file   Social History Narrative  . No narrative on file     Review of Systems: General: negative for chills, fever, night sweats or weight changes.  Cardiovascular: negative for chest pain, dyspnea on exertion, edema, orthopnea, palpitations, paroxysmal nocturnal dyspnea or shortness of breath Dermatological: negative for rash Respiratory: negative for cough or wheezing Urologic: negative for hematuria Abdominal: negative for nausea, vomiting, diarrhea, bright red blood per rectum, melena, or hematemesis Neurologic: negative for visual changes, syncope, or dizziness All other systems reviewed and are otherwise negative except as noted above.    Blood pressure (!) 151/88, pulse 94, height 5\' 8"  (1.727 m), weight 258 lb (117 kg).  General appearance: alert and no distress Neck: no adenopathy, no carotid bruit, no JVD, supple, symmetrical, trachea midline and thyroid not enlarged, symmetric, no tenderness/mass/nodules Lungs: clear to auscultation bilaterally Heart: regular rate and rhythm, S1, S2 normal, no murmur, click, rub or gallop Extremities: extremities normal, atraumatic, no cyanosis or edema  EKG sinus rhythm at 94 without ST or T-wave changes. Personally reviewed this EKG.  ASSESSMENT AND PLAN:   Essential hypertension History essential hypertension blood pressure is 151/88. She is on lisinopril. Continue current meds are Cardizem  Hyperlipidemia History of hyperlipidemia recently begun on pravastatin followed by her PCP  Chest pain  at rest Miranda Lee was referred by Miranda Skeans PA-C for evaluation of new onset chest pain. Miranda Lee has history of hypertension, hypokalemia, diabetes and family history. She had a younger brother that died at age 28 of a myocardial infarction. She's never had a heart attack or stroke. Approximately 2 weeks ago just prior to going to bed she developed  substernal chest pain rating towards jaw LFTs and several minutes. She got up and this recurred. She's had increasing dyspnea on exertion since that time. Exam is benign. EKG shows no acute changes. I'm going to get an exercise Myoview stress test and a 2-D echocardiogram to evaluate and we'll see her back after that for further evaluation.      Miranda Harp MD [place], [facility] 11/08/2016 5:05 PM

## 2016-11-08 NOTE — Assessment & Plan Note (Signed)
History of hyperlipidemia recently begun on pravastatin followed by her PCP

## 2016-11-08 NOTE — Patient Instructions (Signed)
Medication Instructions: Your physician recommends that you continue on your current medications as directed. Please refer to the Current Medication list given to you today.   Testing/Procedures: Your physician has requested that you have an exercise stress myoview. For further information please visit [email or web address]. Please follow instruction sheet, as given.  Follow-Up: Your physician recommends that you schedule a follow-up appointment with Dr. Gwenlyn Found after test.

## 2016-11-08 NOTE — Assessment & Plan Note (Signed)
Miranda Lee was referred by Starlyn Skeans PA-C for evaluation of new onset chest pain. Miranda Lee has history of hypertension, hypokalemia, diabetes and family history. She had a younger brother that died at age 50 of a myocardial infarction. She's never had a heart attack or stroke. Approximately 2 weeks ago just prior to going to bed she developed substernal chest pain rating towards jaw LFTs and several minutes. She got up and this recurred. She's had increasing dyspnea on exertion since that time. Exam is benign. EKG shows no acute changes. I'm going to get an exercise Myoview stress test and a 2-D echocardiogram to evaluate and we'll see her back after that for further evaluation.

## 2016-11-11 ENCOUNTER — Telehealth (HOSPITAL_COMMUNITY): Payer: Self-pay | Admitting: *Deleted

## 2016-11-11 NOTE — Telephone Encounter (Signed)
Left message on voicemail in reference to upcoming appointment scheduled for 11/13/16. Phone number given for a call back so details instructions can be given. Miranda Lee

## 2016-11-12 ENCOUNTER — Telehealth (HOSPITAL_COMMUNITY): Payer: Self-pay | Admitting: *Deleted

## 2016-11-12 NOTE — Telephone Encounter (Signed)
Patient given detailed instructions per Myocardial Perfusion Study Information Sheet for the test on 11/13/16 at 1230. Patient notified to arrive 15 minutes early and that it is imperative to arrive on time for appointment to keep from having the test rescheduled.  If you need to cancel or reschedule your appointment, please call the office within 24 hours of your appointment. . Patient verbalized understanding.Clinten Howk, Ranae Palms

## 2016-11-13 ENCOUNTER — Ambulatory Visit (HOSPITAL_COMMUNITY): Payer: BLUE CROSS/BLUE SHIELD | Attending: Cardiovascular Disease

## 2016-11-13 DIAGNOSIS — R079 Chest pain, unspecified: Secondary | ICD-10-CM | POA: Insufficient documentation

## 2016-11-13 DIAGNOSIS — R0609 Other forms of dyspnea: Secondary | ICD-10-CM | POA: Insufficient documentation

## 2016-11-13 DIAGNOSIS — I10 Essential (primary) hypertension: Secondary | ICD-10-CM | POA: Insufficient documentation

## 2016-11-13 DIAGNOSIS — Z8249 Family history of ischemic heart disease and other diseases of the circulatory system: Secondary | ICD-10-CM | POA: Diagnosis not present

## 2016-11-13 MED ORDER — TECHNETIUM TC 99M TETROFOSMIN IV KIT
32.3000 | PACK | Freq: Once | INTRAVENOUS | Status: AC | PRN
  Administered 2016-11-13: 32.3 via INTRAVENOUS
  Filled 2016-11-13: qty 33

## 2016-11-13 MED ORDER — REGADENOSON 0.4 MG/5ML IV SOLN
0.4000 mg | Freq: Once | INTRAVENOUS | Status: AC
  Administered 2016-11-13: 0.4 mg via INTRAVENOUS

## 2016-11-14 ENCOUNTER — Ambulatory Visit (HOSPITAL_COMMUNITY): Payer: BLUE CROSS/BLUE SHIELD | Attending: Cardiology

## 2016-11-14 LAB — MYOCARDIAL PERFUSION IMAGING
LV dias vol: 84 mL (ref 46–106)
LV sys vol: 42 mL
Peak HR: 117 {beats}/min
RATE: 0.31
Rest HR: 106 {beats}/min
SDS: 0
SRS: 4
SSS: 4
TID: 0.97

## 2016-11-14 MED ORDER — TECHNETIUM TC 99M TETROFOSMIN IV KIT
32.0000 | PACK | Freq: Once | INTRAVENOUS | Status: AC | PRN
  Administered 2016-11-14: 32 via INTRAVENOUS
  Filled 2016-11-14: qty 32

## 2016-11-18 ENCOUNTER — Other Ambulatory Visit: Payer: Self-pay

## 2016-11-18 ENCOUNTER — Ambulatory Visit (HOSPITAL_COMMUNITY): Payer: BLUE CROSS/BLUE SHIELD | Attending: Cardiovascular Disease

## 2016-11-18 DIAGNOSIS — R079 Chest pain, unspecified: Secondary | ICD-10-CM | POA: Insufficient documentation

## 2016-11-18 DIAGNOSIS — I10 Essential (primary) hypertension: Secondary | ICD-10-CM | POA: Diagnosis not present

## 2016-11-18 DIAGNOSIS — R0600 Dyspnea, unspecified: Secondary | ICD-10-CM | POA: Insufficient documentation

## 2016-11-18 DIAGNOSIS — E785 Hyperlipidemia, unspecified: Secondary | ICD-10-CM | POA: Diagnosis not present

## 2016-12-04 ENCOUNTER — Encounter: Payer: Self-pay | Admitting: *Deleted

## 2016-12-04 ENCOUNTER — Ambulatory Visit: Payer: BLUE CROSS/BLUE SHIELD | Admitting: Cardiovascular Disease

## 2017-05-06 DIAGNOSIS — K048 Radicular cyst: Secondary | ICD-10-CM

## 2017-05-06 HISTORY — DX: Radicular cyst: K04.8

## 2017-10-29 ENCOUNTER — Other Ambulatory Visit: Payer: Self-pay | Admitting: Obstetrics and Gynecology

## 2017-10-29 ENCOUNTER — Other Ambulatory Visit (HOSPITAL_COMMUNITY)
Admission: RE | Admit: 2017-10-29 | Discharge: 2017-10-29 | Disposition: A | Discharge status: Home / Self Care | Payer: BLUE CROSS/BLUE SHIELD | Source: Ambulatory Visit | Attending: Obstetrics and Gynecology | Admitting: Obstetrics and Gynecology

## 2017-10-29 DIAGNOSIS — Z01411 Encounter for gynecological examination (general) (routine) with abnormal findings: Secondary | ICD-10-CM | POA: Insufficient documentation

## 2017-11-03 LAB — CYTOLOGY - PAP
HPV 16/18/45 genotyping: POSITIVE — AB
HPV: DETECTED — AB

## 2017-11-04 ENCOUNTER — Telehealth: Payer: Self-pay | Admitting: *Deleted

## 2017-11-04 NOTE — Telephone Encounter (Signed)
Called and spoke with the patient, gave the appt for July 12th

## 2017-11-07 ENCOUNTER — Telehealth: Payer: Self-pay | Admitting: *Deleted

## 2017-11-07 NOTE — Telephone Encounter (Signed)
Called and moved the patient's appts from Friday July 12th to Monday July 8th.

## 2017-11-10 ENCOUNTER — Encounter: Payer: Self-pay | Admitting: Obstetrics

## 2017-11-10 ENCOUNTER — Inpatient Hospital Stay: Payer: BLUE CROSS/BLUE SHIELD | Attending: Obstetrics | Admitting: Obstetrics

## 2017-11-10 VITALS — BP 128/86 | HR 112 | Temp 98.1°F | Resp 20 | Ht 69.0 in | Wt 257.6 lb

## 2017-11-10 DIAGNOSIS — C539 Malignant neoplasm of cervix uteri, unspecified: Secondary | ICD-10-CM | POA: Insufficient documentation

## 2017-11-10 DIAGNOSIS — R87618 Other abnormal cytological findings on specimens from cervix uteri: Secondary | ICD-10-CM

## 2017-11-10 DIAGNOSIS — R87619 Unspecified abnormal cytological findings in specimens from cervix uteri: Secondary | ICD-10-CM | POA: Insufficient documentation

## 2017-11-10 DIAGNOSIS — IMO0002 Reserved for concepts with insufficient information to code with codable children: Secondary | ICD-10-CM

## 2017-11-10 NOTE — Progress Notes (Signed)
Procedure note Cervical Biopsy  Patient was placed in lithotomy position. Speculum inserted. Lugol's applied. A biopsy was taken from 1:00. Hemostasis was obtained with pressure and Monsel's. Patient tolerated the procedure well.

## 2017-11-10 NOTE — Progress Notes (Signed)
GYNECOLOGIC ONCOLOGY - [facility] [place] [place] Note: New Patient FIRST VISIT  Consult was requested by Dr. Thurnell Lose   Chief Complaint  Patient presents with  . Squamous Cell Carcinoma    HPI: Ms. Miranda Lee  is a very nice 52 y.o.  P3  She noted a left axillary/breast mass and scheduled an appointment with Dr.Varnado. 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  She was therefore referred for management.  The patient thinks her last Pap was within the past 5 years, with her PCP, but is not sure of exact date. She thought she was uptodate because the recommendations had changed to be every 3-5 years.   She has noted postcoital spotting over the past several months 2-3 times. She has some dysparunia with deep penetration. Her menstrual cycle has been regular until the last 3 months. She missed a cycle 3 months ago and then the most recent cycle came 2 weeks early. She feels a dull pressure on the right pelvis/groin. She notes chronic back pain. Has noted some neck pain the past 3 months. Notes hot flashes regularly.   --> Allergy to steristrips.  Imported EPIC Oncologic History:    No history exists.    Measurement of disease:  Pending further workup . ______  Radiology: . Pending further workup _____;  . Has CT AP from 2009 in [facility] system  Current Meds:  Outpatient Encounter Medications as of 11/10/2017  Medication Sig  . amitriptyline (ELAVIL) 10 MG tablet 1 TABLET 1-3 HOURS BEFORE BEDTIME ONCE A DAY ORALLY 30 DAY(S)  . amphetamine-dextroamphetamine (ADDERALL) 10 MG tablet Take 10 mg by mouth every morning.  . celecoxib (CELEBREX) 100 MG capsule TAKE 1 CAPSULE BY MOUTH EVERY DAY WITH FOOD  . DULoxetine (CYMBALTA) 60 MG capsule 1 capsule 2 (two) times daily.  . hydrochlorothiazide (HYDRODIURIL) 25 MG tablet Take 25  mg by mouth daily.  Marland Kitchen lisinopril (PRINIVIL,ZESTRIL) 10 MG tablet Take 10 mg by mouth daily.  . metFORMIN (GLUMETZA) 1000 MG (MOD) 24 hr tablet Take 1,000 mg by mouth 2 (two) times daily with a meal.   . methylphenidate 36 MG PO CR tablet Take 2 tablets by mouth 2 (two) times daily.  . orphenadrine (NORFLEX) 100 MG tablet TAKE 1 TABLET AT BEDTIME TWICE A DAY AS NEEDED  . pantoprazole (PROTONIX) 20 MG tablet Take 20 mg by mouth daily.  . pravastatin (PRAVACHOL) 40 MG tablet Take 40 mg by mouth daily.  . [DISCONTINUED] Celecoxib (CELEBREX PO) Take by mouth.  . [DISCONTINUED] DULoxetine HCl (CYMBALTA PO) Take by mouth.  . [DISCONTINUED] methylphenidate 36 MG PO CR tablet Take 36 mg by mouth daily.  . [DISCONTINUED] pregabalin (LYRICA) 75 MG capsule Take 75 mg by mouth 2 (two) times daily.   No facility-administered encounter medications on file as of 11/10/2017.     Allergy:  Allergies  Allergen Reactions  . Vicodin [Hydrocodone-Acetaminophen] Itching  . Sulfa Antibiotics Rash    Really Sick on the stomach and itching  . Latex Itching  . Tetracyclines & Related Itching    Past Surgical Hx:  Past Surgical History:  Procedure Laterality Date  . CHOLECYSTECTOMY  2000  . KNEE SURGERY     7 surgeries, age 47, 35's, and in 30's  . OTHER SURGICAL HISTORY  2003   Ankle fusion  . [place]  Hernia surgery  . ROTATOR CUFF REPAIR  2004    Past Medical Hx:  Past Medical History:  Diagnosis Date  . Anxiety   . Diabetes mellitus without complication ([place])   . High cholesterol   . Hypertension   . Kidney stones     Past Gynecological History:   GYNECOLOGIC HISTORY:  No LMP recorded. Menarche: 52 years old P 3 LMP 2 weeks ago, ongoing Contraceptive none HRT none  Last Pap thinks 5 years ago, prior to current referral Pap 2019 - SCCa  Family Hx:  Family History  Problem Relation Age of Onset  . Throat cancer Father   . Hypertension Brother   . Throat cancer  Paternal Aunt   . Throat cancer Cousin     Social Hx:  Tobacco use: never Alcohol use: 1 glass of wine every other week Illicit Drug use: none Illicit IV Drug use: none  Review of Systems:  Review of Systems  Constitutional: Positive for fatigue.  Gastrointestinal: Positive for nausea.  Endocrine: Positive for hot flashes.  Genitourinary: Positive for pelvic pain and vaginal bleeding.   Musculoskeletal: Positive for back pain.  Neurological: Positive for headaches and numbness.  Psychiatric/Behavioral: The patient is nervous/anxious.   All other systems reviewed and are negative.  Urinary urgency Hip /groin pain  Vitals:  Blood pressure 128/86, pulse (!) 112, temperature 98.1 F (36.7 C), temperature source Oral, resp. rate 20, height 5\' 9"  (1.753 m), weight 257 lb 9.6 oz (116.8 kg), SpO2 96 %. Body mass index is 38.04 kg/m.   Physical Exam:  General :  Overweight, well developed, 52 y.o., female in no apparent distress HEENT:  Normocephalic/atraumatic, symmetric, EOMI, eyelids normal Neck:   Supple, no masses.  Lymphatics:  No cervical/ submandibular/ supraclavicular/ infraclavicular/ inguinal adenopathy Respiratory:  Respirations unlabored, no use of accessory muscles CV:   Deferred Breast:  Deferred Musculoskeletal: No CVA tenderness, normal muscle strength. Abdomen:  Small pannus, obese, soft, non-tender and nondistended. No evidence of hernia. No masses. Extremities:  No lymphedema, no erythema, non-tender. Skin:   Normal inspection Neuro/Psych:  No focal motor deficit, no abnormal mental status. Normal gait. Normal affect. Alert and oriented to person, place, and time  Genito Urinary: Vulva: Normal external female genitalia.  Bladder/urethra: Urethral meatus normal in size and location. No lesions or   masses, well supported bladder Speculum exam: Vagina: No lesion, no discharge, no bleeding. Cervix: Ecchymosis versus other mostly 11-2:00. Decreased uptake with  Lugol's focused at 1:00. Biopsy taken here. No obvious fugating lesion. Cervix is firm, but no irregularity to its contour.  Bimanual exam: as above Uterus: Difficult to delineate fundus given habitus. mobile.  Adnexa: No masses. Rectovaginal:  Good tone, no masses, no cul de sac nodularity, no parametrial involvement or nodularity.   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 No gross lesion today o Biopsy was done  2. Depending on results recommendation will likely be cervical conization to determine simple hyst versus radical 3. We did discuss general treatments if CIS versus cancer  4. Imaging will depend on invasive diagnosis 5. She is obese. A radical procedure will be challenging and she will need appropriate counseling. o If cancer, radiation unlikely to be recommended as upfront therapy as there is no large lesion. o Will discuss on return 6. Likely next step is [facility] (+/- D&C) and then return 10-14 days postop.   [name], MD  11/10/2017, 12:40 PM    Cc: Thurnell Lose, MD (Referring Ob/Gyn)  [facility], [place] (PCP)

## 2017-11-10 NOTE — H&P (View-Only) (Signed)
GYNECOLOGIC ONCOLOGY - [facility] [place] Note: New Patient FIRST VISIT  Consult was requested by Dr. Thurnell Lose   Chief Complaint  Patient presents with  . Squamous Cell Carcinoma    HPI: Miranda Lee  is a very nice 52 y.o.  P3  She noted a left axillary/breast mass and scheduled an appointment with Dr.Varnado. 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  She was therefore referred for management.  The patient thinks her last Pap was within the past 5 years, with her PCP, but is not sure of exact date. She thought she was uptodate because the recommendations had changed to be every 3-5 years.   She has noted postcoital spotting over the past several months 2-3 times. She has some dysparunia with deep penetration. Her menstrual cycle has been regular until the last 3 months. She missed a cycle 3 months ago and then the most recent cycle came 2 weeks early. She feels a dull pressure on the right pelvis/groin. She notes chronic back pain. Has noted some neck pain the past 3 months. Notes hot flashes regularly.   --> Allergy to steristrips.  Imported EPIC Oncologic History:    No history exists.    Measurement of disease:  Pending further workup . ______  Radiology: . Pending further workup _____;  . Has CT AP from 2009 in [facility] system  Current Meds:  Outpatient Encounter Medications as of 11/10/2017  Medication Sig  . amitriptyline (ELAVIL) 10 MG tablet 1 TABLET 1-3 HOURS BEFORE BEDTIME ONCE A DAY ORALLY 30 DAY(S)  . amphetamine-dextroamphetamine (ADDERALL) 10 MG tablet Take 10 mg by mouth every morning.  . celecoxib (CELEBREX) 100 MG capsule TAKE 1 CAPSULE BY MOUTH EVERY DAY WITH FOOD  . DULoxetine (CYMBALTA) 60 MG capsule 1 capsule 2 (two) times daily.  . hydrochlorothiazide (HYDRODIURIL) 25 MG tablet Take 25  mg by mouth daily.  Marland Kitchen lisinopril (PRINIVIL,ZESTRIL) 10 MG tablet Take 10 mg by mouth daily.  . metFORMIN (GLUMETZA) 1000 MG (MOD) 24 hr tablet Take 1,000 mg by mouth 2 (two) times daily with a meal.   . methylphenidate 36 MG PO CR tablet Take 2 tablets by mouth 2 (two) times daily.  . orphenadrine (NORFLEX) 100 MG tablet TAKE 1 TABLET AT BEDTIME TWICE A DAY AS NEEDED  . pantoprazole (PROTONIX) 20 MG tablet Take 20 mg by mouth daily.  . pravastatin (PRAVACHOL) 40 MG tablet Take 40 mg by mouth daily.  . [DISCONTINUED] Celecoxib (CELEBREX PO) Take by mouth.  . [DISCONTINUED] DULoxetine HCl (CYMBALTA PO) Take by mouth.  . [DISCONTINUED] methylphenidate 36 MG PO CR tablet Take 36 mg by mouth daily.  . [DISCONTINUED] pregabalin (LYRICA) 75 MG capsule Take 75 mg by mouth 2 (two) times daily.   No facility-administered encounter medications on file as of 11/10/2017.     Allergy:  Allergies  Allergen Reactions  . Vicodin [Hydrocodone-Acetaminophen] Itching  . Sulfa Antibiotics Rash    Really Sick on the stomach and itching  . Latex Itching  . Tetracyclines & Related Itching    Past Surgical Hx:  Past Surgical History:  Procedure Laterality Date  . CHOLECYSTECTOMY  2000  . KNEE SURGERY     8 surgeries, age 4, 44's, and in 57's  . OTHER SURGICAL HISTORY  2003   Ankle fusion  . [place]  Hernia surgery  . ROTATOR CUFF REPAIR  2004    Past Medical Hx:  Past Medical History:  Diagnosis Date  . Anxiety   . Diabetes mellitus without complication ([place])   . High cholesterol   . Hypertension   . Kidney stones     Past Gynecological History:   GYNECOLOGIC HISTORY:  No LMP recorded. Menarche: 53 years old P 3 LMP 2 weeks ago, ongoing Contraceptive none HRT none  Last Pap thinks 5 years ago, prior to current referral Pap 2019 - SCCa  Family Hx:  Family History  Problem Relation Age of Onset  . Throat cancer Father   . Hypertension Brother   . Throat cancer  Paternal Aunt   . Throat cancer Cousin     Social Hx:  Tobacco use: never Alcohol use: 1 glass of wine every other week Illicit Drug use: none Illicit IV Drug use: none  Review of Systems:  Review of Systems  Constitutional: Positive for fatigue.  Gastrointestinal: Positive for nausea.  Endocrine: Positive for hot flashes.  Genitourinary: Positive for pelvic pain and vaginal bleeding.   Musculoskeletal: Positive for back pain.  Neurological: Positive for headaches and numbness.  Psychiatric/Behavioral: The patient is nervous/anxious.   All other systems reviewed and are negative.  Urinary urgency Hip /groin pain  Vitals:  Blood pressure 128/86, pulse (!) 112, temperature 98.1 F (36.7 C), temperature source Oral, resp. rate 20, height 5\' 9"  (1.753 m), weight 257 lb 9.6 oz (116.8 kg), SpO2 96 %. Body mass index is 38.04 kg/m.   Physical Exam:  General :  Overweight, well developed, 52 y.o., female in no apparent distress HEENT:  Normocephalic/atraumatic, symmetric, EOMI, eyelids normal Neck:   Supple, no masses.  Lymphatics:  No cervical/ submandibular/ supraclavicular/ infraclavicular/ inguinal adenopathy Respiratory:  Respirations unlabored, no use of accessory muscles CV:   Deferred Breast:  Deferred Musculoskeletal: No CVA tenderness, normal muscle strength. Abdomen:  Small pannus, obese, soft, non-tender and nondistended. No evidence of hernia. No masses. Extremities:  No lymphedema, no erythema, non-tender. Skin:   Normal inspection Neuro/Psych:  No focal motor deficit, no abnormal mental status. Normal gait. Normal affect. Alert and oriented to person, place, and time  Genito Urinary: Vulva: Normal external female genitalia.  Bladder/urethra: Urethral meatus normal in size and location. No lesions or   masses, well supported bladder Speculum exam: Vagina: No lesion, no discharge, no bleeding. Cervix: Ecchymosis versus other mostly 11-2:00. Decreased uptake with  Lugol's focused at 1:00. Biopsy taken here. No obvious fugating lesion. Cervix is firm, but no irregularity to its contour.  Bimanual exam: as above Uterus: Difficult to delineate fundus given habitus. mobile.  Adnexa: No masses. Rectovaginal:  Good tone, no masses, no cul de sac nodularity, no parametrial involvement or nodularity.   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 No gross lesion today o Biopsy was done  2. Depending on results recommendation will likely be cervical conization to determine simple hyst versus radical 3. We did discuss general treatments if CIS versus cancer  4. Imaging will depend on invasive diagnosis 5. She is obese. A radical procedure will be challenging and she will need appropriate counseling. o If cancer, radiation unlikely to be recommended as upfront therapy as there is no large lesion. o Will discuss on return 6. Likely next step is cone (+/- D&C) and then return 10-14 days postop.   Miranda Caprice, MD  11/10/2017, 12:40 PM    Cc: Thurnell Lose, MD (Referring Ob/Gyn)  [facility], [place] (PCP)

## 2017-11-10 NOTE — Patient Instructions (Signed)
We will follow-up with your biopsy performed today. Potentially recommend a procedure such as cone biopsy as the next step.  Nothing per vagina.  Please call our office if you have any excess bleeding.

## 2017-11-11 ENCOUNTER — Other Ambulatory Visit: Payer: Self-pay | Admitting: Internal Medicine

## 2017-11-11 DIAGNOSIS — Z1231 Encounter for screening mammogram for malignant neoplasm of breast: Secondary | ICD-10-CM

## 2017-11-13 ENCOUNTER — Telehealth: Payer: Self-pay | Admitting: Gynecologic Oncology

## 2017-11-13 ENCOUNTER — Telehealth: Payer: Self-pay

## 2017-11-13 NOTE — Telephone Encounter (Signed)
Told Ms McDainiel that her surgery is scheduled for 11-18-17 at 1230 pm.  She will receive a call from the out patient surgery nurse to be given directions. Told her that she will receive discharge instructions after procedure but to expect vaginal spotting and or light bleeding for ~2 weeks post procedure per Joylene John, NP.

## 2017-11-13 NOTE — Telephone Encounter (Signed)
Attempted to call patient to discuss biopsy results and recommendation for conization of the cervix with ECC with Dr. Gerarda Fraction.  Left message asking her to please call the office to discuss.

## 2017-11-14 ENCOUNTER — Other Ambulatory Visit: Payer: Self-pay

## 2017-11-14 ENCOUNTER — Ambulatory Visit: Payer: BLUE CROSS/BLUE SHIELD | Admitting: Obstetrics

## 2017-11-14 ENCOUNTER — Encounter (HOSPITAL_BASED_OUTPATIENT_CLINIC_OR_DEPARTMENT_OTHER): Payer: Self-pay | Admitting: *Deleted

## 2017-11-14 NOTE — Telephone Encounter (Signed)
Returned call to patient.  Discussed biopsy results along with Dr. Gerarda Fraction recommendations for CKC and ECC.  Verbalizing understanding and stating she would like it to be done as soon as possible.  Advised she would be contacted with a date and time for her procedure.

## 2017-11-14 NOTE — Progress Notes (Addendum)
Spoke w/ pt via phone for pre-op interview.  Npo after mn w/ exception clear liquids until 0645.  Arrive at 1045.  Needs istat 8, urine preg. and ekg.  Will take protonix  am dos w/ sips of water.

## 2017-11-18 ENCOUNTER — Ambulatory Visit (HOSPITAL_BASED_OUTPATIENT_CLINIC_OR_DEPARTMENT_OTHER)
Admission: RE | Admit: 2017-11-18 | Discharge: 2017-11-18 | Disposition: A | Discharge status: Home / Self Care | Payer: BLUE CROSS/BLUE SHIELD | Source: Ambulatory Visit | Attending: Obstetrics | Admitting: Obstetrics

## 2017-11-18 ENCOUNTER — Encounter (HOSPITAL_BASED_OUTPATIENT_CLINIC_OR_DEPARTMENT_OTHER)
Admission: RE | Disposition: A | Discharge status: Home / Self Care | Payer: Self-pay | Source: Ambulatory Visit | Attending: Obstetrics

## 2017-11-18 ENCOUNTER — Ambulatory Visit (HOSPITAL_BASED_OUTPATIENT_CLINIC_OR_DEPARTMENT_OTHER): Payer: BLUE CROSS/BLUE SHIELD | Admitting: Anesthesiology

## 2017-11-18 ENCOUNTER — Encounter (HOSPITAL_BASED_OUTPATIENT_CLINIC_OR_DEPARTMENT_OTHER): Payer: Self-pay

## 2017-11-18 ENCOUNTER — Other Ambulatory Visit: Payer: Self-pay

## 2017-11-18 DIAGNOSIS — E7800 Pure hypercholesterolemia, unspecified: Secondary | ICD-10-CM | POA: Diagnosis not present

## 2017-11-18 DIAGNOSIS — N871 Moderate cervical dysplasia: Secondary | ICD-10-CM | POA: Diagnosis present

## 2017-11-18 DIAGNOSIS — R51 Headache: Secondary | ICD-10-CM | POA: Diagnosis not present

## 2017-11-18 DIAGNOSIS — Z87442 Personal history of urinary calculi: Secondary | ICD-10-CM | POA: Diagnosis not present

## 2017-11-18 DIAGNOSIS — F419 Anxiety disorder, unspecified: Secondary | ICD-10-CM | POA: Diagnosis not present

## 2017-11-18 DIAGNOSIS — D069 Carcinoma in situ of cervix, unspecified: Secondary | ICD-10-CM | POA: Insufficient documentation

## 2017-11-18 DIAGNOSIS — Z9049 Acquired absence of other specified parts of digestive tract: Secondary | ICD-10-CM | POA: Diagnosis not present

## 2017-11-18 DIAGNOSIS — Z800 Family history of malignant neoplasm of digestive organs: Secondary | ICD-10-CM | POA: Diagnosis not present

## 2017-11-18 DIAGNOSIS — I10 Essential (primary) hypertension: Secondary | ICD-10-CM | POA: Diagnosis not present

## 2017-11-18 DIAGNOSIS — Z8249 Family history of ischemic heart disease and other diseases of the circulatory system: Secondary | ICD-10-CM | POA: Diagnosis not present

## 2017-11-18 DIAGNOSIS — R200 Anesthesia of skin: Secondary | ICD-10-CM | POA: Diagnosis not present

## 2017-11-18 DIAGNOSIS — N879 Dysplasia of cervix uteri, unspecified: Secondary | ICD-10-CM | POA: Diagnosis not present

## 2017-11-18 DIAGNOSIS — E119 Type 2 diabetes mellitus without complications: Secondary | ICD-10-CM | POA: Insufficient documentation

## 2017-11-18 DIAGNOSIS — K219 Gastro-esophageal reflux disease without esophagitis: Secondary | ICD-10-CM | POA: Insufficient documentation

## 2017-11-18 DIAGNOSIS — Z6838 Body mass index (BMI) 38.0-38.9, adult: Secondary | ICD-10-CM | POA: Diagnosis not present

## 2017-11-18 DIAGNOSIS — E669 Obesity, unspecified: Secondary | ICD-10-CM | POA: Insufficient documentation

## 2017-11-18 DIAGNOSIS — Z7984 Long term (current) use of oral hypoglycemic drugs: Secondary | ICD-10-CM | POA: Diagnosis not present

## 2017-11-18 DIAGNOSIS — Z91048 Other nonmedicinal substance allergy status: Secondary | ICD-10-CM | POA: Insufficient documentation

## 2017-11-18 DIAGNOSIS — Z79899 Other long term (current) drug therapy: Secondary | ICD-10-CM | POA: Insufficient documentation

## 2017-11-18 HISTORY — DX: Iron deficiency anemia, unspecified: D50.9

## 2017-11-18 HISTORY — DX: Unspecified osteoarthritis, unspecified site: M19.90

## 2017-11-18 HISTORY — DX: Calculus of kidney: N20.0

## 2017-11-18 HISTORY — DX: Gastro-esophageal reflux disease without esophagitis: K21.9

## 2017-11-18 HISTORY — DX: Other specified postprocedural states: Z98.890

## 2017-11-18 HISTORY — DX: Dysplasia of cervix uteri, unspecified: N87.9

## 2017-11-18 HISTORY — DX: Type 2 diabetes mellitus without complications: E11.9

## 2017-11-18 HISTORY — DX: Presence of spectacles and contact lenses: Z97.3

## 2017-11-18 HISTORY — DX: Other seasonal allergic rhinitis: J30.2

## 2017-11-18 HISTORY — PX: CERVICAL CONIZATION W/BX: SHX1330

## 2017-11-18 HISTORY — DX: Personal history of other specified conditions: Z87.898

## 2017-11-18 HISTORY — DX: Mixed hyperlipidemia: E78.2

## 2017-11-18 HISTORY — DX: Nausea with vomiting, unspecified: R11.2

## 2017-11-18 LAB — POCT I-STAT, CHEM 8
BUN: 16 mg/dL (ref 6–20)
Calcium, Ion: 1.22 mmol/L (ref 1.15–1.40)
Chloride: 98 mmol/L (ref 98–111)
Creatinine, Ser: 0.7 mg/dL (ref 0.44–1.00)
Glucose, Bld: 232 mg/dL — ABNORMAL HIGH (ref 70–99)
HCT: 38 % (ref 36.0–46.0)
Hemoglobin: 12.9 g/dL (ref 12.0–15.0)
Potassium: 4.2 mmol/L (ref 3.5–5.1)
Sodium: 136 mmol/L (ref 135–145)
TCO2: 25 mmol/L (ref 22–32)

## 2017-11-18 LAB — GLUCOSE, CAPILLARY: Glucose-Capillary: 165 mg/dL — ABNORMAL HIGH (ref 70–99)

## 2017-11-18 LAB — POCT PREGNANCY, URINE: Preg Test, Ur: NEGATIVE

## 2017-11-18 SURGERY — CONE BIOPSY, CERVIX
Anesthesia: General

## 2017-11-18 MED ORDER — LIDOCAINE 2% (20 MG/ML) 5 ML SYRINGE
INTRAMUSCULAR | Status: DC | PRN
  Administered 2017-11-18: 100 mg via INTRAVENOUS

## 2017-11-18 MED ORDER — PROPOFOL 10 MG/ML IV BOLUS
INTRAVENOUS | Status: AC
  Filled 2017-11-18: qty 20

## 2017-11-18 MED ORDER — CEFOXITIN SODIUM 2 G IV SOLR
INTRAVENOUS | Status: AC
  Filled 2017-11-18: qty 2

## 2017-11-18 MED ORDER — ONDANSETRON HCL 4 MG/2ML IJ SOLN
4.0000 mg | Freq: Once | INTRAMUSCULAR | Status: DC | PRN
  Filled 2017-11-18: qty 2

## 2017-11-18 MED ORDER — KETOROLAC TROMETHAMINE 30 MG/ML IJ SOLN
INTRAMUSCULAR | Status: AC
  Filled 2017-11-18: qty 1

## 2017-11-18 MED ORDER — MIDAZOLAM HCL 2 MG/2ML IJ SOLN
INTRAMUSCULAR | Status: AC
  Filled 2017-11-18: qty 2

## 2017-11-18 MED ORDER — LIDOCAINE 2% (20 MG/ML) 5 ML SYRINGE
INTRAMUSCULAR | Status: AC
  Filled 2017-11-18: qty 5

## 2017-11-18 MED ORDER — IODINE STRONG (LUGOLS) 5 % PO SOLN
ORAL | Status: DC | PRN
  Administered 2017-11-18: 0.2 mL via ORAL

## 2017-11-18 MED ORDER — FENTANYL CITRATE (PF) 100 MCG/2ML IJ SOLN
INTRAMUSCULAR | Status: DC | PRN
  Administered 2017-11-18: 50 ug via INTRAVENOUS

## 2017-11-18 MED ORDER — HYDROMORPHONE HCL 1 MG/ML IJ SOLN
0.2500 mg | INTRAMUSCULAR | Status: DC | PRN
  Filled 2017-11-18: qty 0.5

## 2017-11-18 MED ORDER — PROPOFOL 10 MG/ML IV BOLUS
INTRAVENOUS | Status: DC | PRN
  Administered 2017-11-18: 200 mg via INTRAVENOUS

## 2017-11-18 MED ORDER — DEXAMETHASONE SODIUM PHOSPHATE 4 MG/ML IJ SOLN
INTRAMUSCULAR | Status: DC | PRN
  Administered 2017-11-18: 10 mg via INTRAVENOUS

## 2017-11-18 MED ORDER — SCOPOLAMINE 1 MG/3DAYS TD PT72
MEDICATED_PATCH | TRANSDERMAL | Status: DC | PRN
  Administered 2017-11-18: 1 via TRANSDERMAL

## 2017-11-18 MED ORDER — ONDANSETRON HCL 4 MG/2ML IJ SOLN
INTRAMUSCULAR | Status: DC | PRN
  Administered 2017-11-18: 4 mg via INTRAVENOUS

## 2017-11-18 MED ORDER — MONSELS FERRIC SUBSULFATE EX SOLN
CUTANEOUS | Status: DC | PRN
  Administered 2017-11-18: 1 via TOPICAL

## 2017-11-18 MED ORDER — ONDANSETRON HCL 4 MG/2ML IJ SOLN
INTRAMUSCULAR | Status: AC
  Filled 2017-11-18: qty 2

## 2017-11-18 MED ORDER — DEXAMETHASONE SODIUM PHOSPHATE 10 MG/ML IJ SOLN
INTRAMUSCULAR | Status: AC
Start: 2017-11-18 — End: ?
  Filled 2017-11-18: qty 1

## 2017-11-18 MED ORDER — WHITE PETROLATUM EX OINT
TOPICAL_OINTMENT | CUTANEOUS | Status: AC
  Filled 2017-11-18: qty 5

## 2017-11-18 MED ORDER — LIDOCAINE-EPINEPHRINE 1 %-1:100000 IJ SOLN
INTRAMUSCULAR | Status: DC | PRN
  Administered 2017-11-18: 6 mL

## 2017-11-18 MED ORDER — SCOPOLAMINE 1 MG/3DAYS TD PT72
MEDICATED_PATCH | TRANSDERMAL | Status: AC
  Filled 2017-11-18: qty 1

## 2017-11-18 MED ORDER — MIDAZOLAM HCL 5 MG/5ML IJ SOLN
INTRAMUSCULAR | Status: DC | PRN
  Administered 2017-11-18: 2 mg via INTRAVENOUS

## 2017-11-18 MED ORDER — MEPERIDINE HCL 25 MG/ML IJ SOLN
6.2500 mg | INTRAMUSCULAR | Status: DC | PRN
  Filled 2017-11-18: qty 1

## 2017-11-18 MED ORDER — SODIUM CHLORIDE 0.9 % IV SOLN
2.0000 g | INTRAVENOUS | Status: AC
  Administered 2017-11-18: 2 g via INTRAVENOUS
  Filled 2017-11-18: qty 2

## 2017-11-18 MED ORDER — LACTATED RINGERS IV SOLN
INTRAVENOUS | Status: DC
  Administered 2017-11-18 (×2): via INTRAVENOUS
  Filled 2017-11-18: qty 1000

## 2017-11-18 MED ORDER — SODIUM CHLORIDE 0.9 % IV SOLN
INTRAVENOUS | Status: AC
  Filled 2017-11-18: qty 100

## 2017-11-18 MED ORDER — KETOROLAC TROMETHAMINE 30 MG/ML IJ SOLN
INTRAMUSCULAR | Status: DC | PRN
  Administered 2017-11-18: 30 mg via INTRAVENOUS

## 2017-11-18 MED ORDER — FENTANYL CITRATE (PF) 100 MCG/2ML IJ SOLN
INTRAMUSCULAR | Status: AC
  Filled 2017-11-18: qty 2

## 2017-11-18 SURGICAL SUPPLY — 29 items
APL SWBSTK 6 STRL LF DISP (MISCELLANEOUS) ×1
APPLICATOR COTTON TIP 6 STRL (MISCELLANEOUS) ×1 IMPLANT
APPLICATOR COTTON TIP 6IN STRL (MISCELLANEOUS) ×2
BLADE EXTENDED COATED 6.5IN (ELECTRODE) IMPLANT
BLADE SURG 11 STRL SS (BLADE) ×2 IMPLANT
CANISTER SUCT 3000ML PPV (MISCELLANEOUS) ×1 IMPLANT
CATH ROBINSON RED A/P 16FR (CATHETERS) ×1 IMPLANT
CATH SILICONE 16FRX5CC (CATHETERS) ×1 IMPLANT
DILATOR CANAL MILEX (MISCELLANEOUS) ×1 IMPLANT
ELECT BALL LEEP 3MM BLK (ELECTRODE) ×2 IMPLANT
GLOVE BIO SURGEON STRL SZ 6.5 (GLOVE) ×2 IMPLANT
GLOVE BIOGEL PI IND STRL 7.0 (GLOVE) ×1 IMPLANT
GLOVE BIOGEL PI IND STRL 7.5 (GLOVE) IMPLANT
GLOVE BIOGEL PI INDICATOR 7.0 (GLOVE) ×1
GLOVE BIOGEL PI INDICATOR 7.5 (GLOVE) ×3
GOWN STRL REUS W/TWL LRG LVL3 (GOWN DISPOSABLE) ×3 IMPLANT
HEMOSTAT SURGICEL 4X8 (HEMOSTASIS) ×2 IMPLANT
KIT TURNOVER CYSTO (KITS) ×2 IMPLANT
LEGGING LITHOTOMY PAIR STRL (DRAPES) IMPLANT
NS IRRIG 500ML POUR BTL (IV SOLUTION) ×2 IMPLANT
PACK VAGINAL WOMENS (CUSTOM PROCEDURE TRAY) ×2 IMPLANT
SCOPETTES 8  STERILE (MISCELLANEOUS) ×1
SCOPETTES 8 STERILE (MISCELLANEOUS) ×1 IMPLANT
SPONGE SURGIFOAM ABS GEL 12-7 (HEMOSTASIS) IMPLANT
SUT VIC AB 0 CT1 36 (SUTURE) ×2 IMPLANT
SUT VICRYL 0 UR6 27IN ABS (SUTURE) ×4 IMPLANT
SYR BULB IRRIGATION 50ML (SYRINGE) ×2 IMPLANT
TOWEL OR 17X24 6PK STRL BLUE (TOWEL DISPOSABLE) ×4 IMPLANT
VACUUM HOSE/TUBING 7/8INX6FT (MISCELLANEOUS) IMPLANT

## 2017-11-18 NOTE — Interval H&P Note (Signed)
History and Physical Interval Note:  11/18/2017 1:13 PM  Miranda Lee  has presented today for surgery, with the diagnosis of CERVICAL DYSPLASIA  The various methods of treatment have been discussed with the patient and family. After consideration of risks, benefits and other options for treatment, the patient has consented to  Procedure(s): COLD KNIFE CONIZATION OF THE CERVIX WITH ENDOCERVICAL CURETTAGE (N/A) as a surgical intervention .  The patient's history has been reviewed, patient examined, no change in status, stable for surgery.  I have reviewed the patient's chart and labs.  Questions were answered to the patient's satisfaction.     [name]

## 2017-11-18 NOTE — Transfer of Care (Signed)
Last Vitals:  Vitals Value Taken Time  BP 146/83 11/18/2017  2:21 PM  Temp    Pulse 94 11/18/2017  2:24 PM  Resp 14 11/18/2017  2:24 PM  SpO2 98 % 11/18/2017  2:24 PM  Vitals shown include unvalidated device data.  Last Pain:  Vitals:   11/18/17 1032  TempSrc:   PainSc: 0-No pain      Patients Stated Pain Goal: 5 (11/18/17 1032)  Immediate Anesthesia Transfer of Care Note  Patient: Miranda Lee  Procedure(s) Performed: Procedure(s) (LRB): COLD KNIFE CONIZATION OF THE CERVIX WITH ENDOCERVICAL CURETTAGE (N/A)  Patient Location: PACU  Anesthesia Type: General  Level of Consciousness: awake, alert  and oriented  Airway & Oxygen Therapy: Patient Spontanous Breathing and Patient connected to nasal cannula oxygen  Post-op Assessment: Report given to PACU RN and Post -op Vital signs reviewed and stable  Post vital signs: Reviewed and stable  Complications: No apparent anesthesia complications

## 2017-11-18 NOTE — Anesthesia Procedure Notes (Signed)
Procedure Name: LMA Insertion Performed by: Lillia Abed, MD Pre-anesthesia Checklist: Patient identified, Emergency Drugs available, Suction available and Patient being monitored Patient Re-evaluated:Patient Re-evaluated prior to induction Oxygen Delivery Method: Circle system utilized Preoxygenation: Pre-oxygenation with 100% oxygen Induction Type: IV induction Ventilation: Mask ventilation without difficulty LMA: LMA inserted LMA Size: 4.0 Number of attempts: 1 Airway Equipment and Method: Bite block Placement Confirmation: positive ETCO2 Tube secured with: Tape Dental Injury: Teeth and Oropharynx as per pre-operative assessment

## 2017-11-18 NOTE — Op Note (Signed)
OPERATIVE NOTE 11/18/17   Surgeon: Mart Piggs, MD  Assistants: MS3 Lexi  Anesthesia: General LMA anesthesia  Pre-operative Diagnosis:  Cervical dysplasia  Post-operative Diagnosis: same  Operation:  Cold knife cervical conization and endocervical curettage  Operative Findings:  No gross lesion. Decreased uptake with Lugol's from 1:00-3:00 just external to Tzone.  Estimated Blood Loss:  5 cc      Specimens: cervical cone tagged at 12:00, endocervical curettings         Complications:  None; patient tolerated the procedure well.         Disposition: PACU - hemodynamically stable.  Procedure Details:  The patient was then taken to the operating room and placed in the supine position with SCD hose on. LMA anesthesia was then induced without difficulty. She was then placed in the dorsolithotomy position. The perineum was prepped with Betadine.  The patient was then draped after the prep was dried. An in and out catheterization to empty the bladder was performed under sterile conditions.  Timeout was performed the patient, procedure, antibiotic, allergy, and length of procedure.   A Grave's speculum was placed and the cervix visualized. The vaginal walls were prepped with betadine. The cervix was treated with Lugol's solution and findings as noted.  A weighted speculum was placed in the posterior vagina after additional gentle betadine prep. The right angle retractor was placed anteriorally to visualize the cervix. Additional Lugol's applied. A tenaculum was placed on the anterior lip of the cervix. 1% lidocaine with epinephrine was injected at 2/08/11/08:00. A 0-vicryl suture on a UR-6 needle was used to place stay sutures (which were tagged) at 3 and 9 o'clock of the cervicovaginal junction. An 11 blade scalpel on a long knife handle was used to make an incision around the face of the cervix, inside of the stay sutures, circumferentially. An allis clamp was used to grasp the  specimen to manipulate it. The incision was then continued using the knife followed deeper by the Mayo scissors by angling towards the endocervix to amputate the specimen. It was removed, oriented with a marking stitch at the 12 o'clock ectocervix and sent to pathology.   A post-cone ECC was collected from the endocervical canal using a Box curette.  The bovie with the 58mm ball attachment was used at 50 coag to create hemostasis at the surgical bed. Monsels and a Surgicell were applied to the surgical bed to consolidate this hemostasis.  All instrument, suture, laparotomy, Ray-Tec, and needle counts were correct x2. The patient tolerated the procedure well and was taken recovery room in stable condition.

## 2017-11-18 NOTE — Discharge Instructions (Signed)
Call your surgeon if you experience:   1.  Fever over 101.0. 2.  Inability to urinate. 3.  Nausea and/or vomiting. 4.  Extreme swelling or bruising at the surgical site. 5.  Continued bleeding from the incision. 6.  Increased pain, redness or drainage from the incision. 7.  Problems related to your pain medication. 8.  Any problems and/or concerns. 9. Heavy bleeding (soaking a traditional peripad in one hour ). 10. Nothing in the vagina until you see the doctor.     Post Anesthesia Home Care Instructions  Activity: Get plenty of rest for the remainder of the day. A responsible individual must stay with you for 24 hours following the procedure.  For the next 24 hours, DO NOT: -Drive a car -Paediatric nurse -Drink alcoholic beverages -Take any medication unless instructed by your physician -Make any legal decisions or sign important papers.  Meals: Start with liquid foods such as gelatin or soup. Progress to regular foods as tolerated. Avoid greasy, spicy, heavy foods. If nausea and/or vomiting occur, drink only clear liquids until the nausea and/or vomiting subsides. Call your physician if vomiting continues.  Special Instructions/Symptoms: Your throat may feel dry or sore from the anesthesia or the breathing tube placed in your throat during surgery. If this causes discomfort, gargle with warm salt water. The discomfort should disappear within 24 hours.  If you had a scopolamine patch placed behind your ear for the management of post- operative nausea and/or vomiting:  1. The medication in the patch is effective for 72 hours, after which it should be removed.  Wrap patch in a tissue and discard in the trash. Wash hands thoroughly with soap and water. 2. You may remove the patch earlier than 72 hours if you experience unpleasant side effects which may include dry mouth, dizziness or visual disturbances. 3. Avoid touching the patch. Wash your hands with soap and water after contact  with the patch.

## 2017-11-18 NOTE — Anesthesia Preprocedure Evaluation (Addendum)
Anesthesia Evaluation  Patient identified by MRN, date of birth, ID band Patient awake    Reviewed: Allergy & Precautions, NPO status , Patient's Chart, lab work & pertinent test results  History of Anesthesia Complications (+) PONV  Airway Mallampati: II  TM Distance: >3 FB Neck ROM: Full    Dental   Pulmonary    Pulmonary exam normal        Cardiovascular hypertension, Pt. on medications Normal cardiovascular exam     Neuro/Psych    GI/Hepatic GERD  Medicated and Controlled,  Endo/Other  diabetes, Type 2, Oral Hypoglycemic Agents  Renal/GU      Musculoskeletal   Abdominal   Peds  Hematology   Anesthesia Other Findings   Reproductive/Obstetrics                             Anesthesia Physical Anesthesia Plan  ASA: II  Anesthesia Plan: General   Post-op Pain Management:    Induction: Intravenous  PONV Risk Score and Plan: 3 and Scopolamine patch - Pre-op, Ondansetron and Midazolam  Airway Management Planned: LMA  Additional Equipment:   Intra-op Plan:   Post-operative Plan: Extubation in OR  Informed Consent: I have reviewed the patients History and Physical, chart, labs and discussed the procedure including the risks, benefits and alternatives for the proposed anesthesia with the patient or authorized representative who has indicated his/her understanding and acceptance.     Plan Discussed with: CRNA and Surgeon  Anesthesia Plan Comments:        Anesthesia Quick Evaluation

## 2017-11-19 ENCOUNTER — Encounter (HOSPITAL_BASED_OUTPATIENT_CLINIC_OR_DEPARTMENT_OTHER): Payer: Self-pay | Admitting: Obstetrics

## 2017-11-19 NOTE — Anesthesia Postprocedure Evaluation (Signed)
Anesthesia Post Note  Patient: Miranda Lee  Procedure(s) Performed: COLD KNIFE CONIZATION OF THE CERVIX WITH ENDOCERVICAL CURETTAGE (N/A )     Patient location during evaluation: PACU Anesthesia Type: General Level of consciousness: sedated and patient cooperative Pain management: pain level controlled Vital Signs Assessment: post-procedure vital signs reviewed and stable Respiratory status: spontaneous breathing Cardiovascular status: stable Anesthetic complications: no    Last Vitals:  Vitals:   11/18/17 1500 11/18/17 1525  BP: 128/82 (!) 145/88  Pulse: 93 90  Resp: 17 18  Temp:  36.7 C  SpO2: 98% 96%    Last Pain:  Vitals:   11/19/17 1022  TempSrc:   PainSc: 2                  Nolon Nations

## 2017-11-21 ENCOUNTER — Telehealth: Payer: Self-pay

## 2017-11-21 NOTE — Telephone Encounter (Addendum)
Told Ms Shawna Clamp that Dr. Gerarda Fraction is out of the office until Wednesday 11-26-17 to review the results.   Pt verbalized understanding.

## 2017-11-24 MED FILL — Ferric Subsulfate (Bulk) Soln: Qty: 100 | Status: AC

## 2017-11-24 NOTE — Telephone Encounter (Addendum)
Told Ms Shannahan that the Pathology showed pre-cancer(dysplasia) per Zoila Shutter Dr. Gerarda Fraction will discuss the plan of care at visit on Monday 12-01-17. Pt verbalized understanding.

## 2017-12-01 ENCOUNTER — Encounter: Payer: Self-pay | Admitting: Gynecologic Oncology

## 2017-12-01 ENCOUNTER — Encounter: Payer: Self-pay | Admitting: Obstetrics

## 2017-12-01 ENCOUNTER — Inpatient Hospital Stay (HOSPITAL_BASED_OUTPATIENT_CLINIC_OR_DEPARTMENT_OTHER): Payer: BLUE CROSS/BLUE SHIELD | Admitting: Obstetrics

## 2017-12-01 VITALS — BP 142/96 | HR 117 | Temp 98.0°F | Resp 20 | Ht 69.0 in | Wt 258.1 lb

## 2017-12-01 DIAGNOSIS — N879 Dysplasia of cervix uteri, unspecified: Secondary | ICD-10-CM

## 2017-12-01 NOTE — Patient Instructions (Signed)
1. OK to return to work with activity restrictions for 2 more weeks. 2. Return in 6 weeks for pelvic to discuss if further surgery indicated

## 2017-12-03 NOTE — Progress Notes (Signed)
[place] at [facility]  Progress Note: Established Patient Follow-up  Consult was originally requested by Dr. Thurnell Lose for a Pap concerning for carcinoma  Chief Complaint  Patient presents with  . Cervical dysplasia    HPI: Ms. Miranda Lee  is a very nice 52 y.o.  P3  . Interval History Since her last visit she underwent cervical conization and ECC on 11/18/17. There were no perioperative complications.  Final pathology revealed: 1. Cervix, cone - HIGH GRADE SQUAMOUS INTRAEPITHELIAL LESION EXTENDING INTO ENDOCERVICAL GLANDS(CIN 2-3; MODERATE TO SEVERE DYSPLASIA) - MARGINS UNINVOLVED BY DYSPLASIA 2. 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  She is doing well. Some vaginal discharge and having her expected menses. Passed the surgicell since surgery. No large amounts bright red blood. No fever/pain.  . Presenting History (Oncologic Course if applicable) She noted a left axillary/breast mass and scheduled an appointment with Dr.Varnado. 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  She was therefore referred for management.  The patient thinks her last Pap was within the past 5 years, with her PCP, but is not sure of exact date. She thought she was uptodate because the recommendations had changed to be every 3-5 years.   She noted postcoital spotting over several months 2-3 times. She had some dysparunia with deep penetration. Her menstrual cycle has been regular until the last 3 months. She missed a cycle 3 months ago and then the most recent cycle came 2 weeks early. She feels a dull pressure on the right pelvis/groin. She notes chronic back pain. Has noted some neck pain the past 3 months. Notes hot flashes regularly.   -->  Allergy to steristrips.  Imported EPIC Oncologic History:    No history exists.    Measurement of disease:  N/a  Radiology: . Has CT AP from 2009 in [facility] system  Current Meds:  Outpatient Encounter Medications as of 12/01/2017  Medication Sig  . amitriptyline (ELAVIL) 10 MG tablet 1 TABLET 1-3 HOURS BEFORE BEDTIME ONCE A DAY ORALLY 30 DAY(S)  . amphetamine-dextroamphetamine (ADDERALL) 10 MG tablet Take 10 mg by mouth every evening. Per pt takes at 1300 daily  . celecoxib (CELEBREX) 100 MG capsule TAKE 1 CAPSULE BY MOUTH EVERY DAY WITH FOOD---takes in am  . Cholecalciferol (VITAMIN D3) 5000 units CAPS Take 1 capsule by mouth daily.  . DULoxetine (CYMBALTA) 60 MG capsule Take 2 capsules by mouth at bedtime.   . Fe Fum-FePoly-Vit C-Vit B3 (INTEGRA) 62.5-62.5-40-3 MG CAPS Take 1 tablet by mouth daily.  . hydrochlorothiazide (HYDRODIURIL) 25 MG tablet Take 25 mg by mouth every morning.   Marland Kitchen lisinopril (PRINIVIL,ZESTRIL) 10 MG tablet Take 10 mg by mouth every morning.   . metFORMIN (GLUMETZA) 1000 MG (MOD) 24 hr tablet Take 1,000 mg by mouth 2 (two) times daily with a meal.   . methylphenidate 36 MG PO CR tablet Take 2 tablets by mouth every morning. CONCERTA--  Name brand  . orphenadrine (NORFLEX) 100 MG tablet TAKE 1 TABLET AT BEDTIME TWICE A DAY AS NEEDED  . pantoprazole (PROTONIX) 20 MG tablet Take 20 mg by mouth every morning.   . pravastatin (PRAVACHOL) 40 MG tablet Take 40 mg by mouth every evening.    No facility-administered encounter medications on file as of 12/01/2017.  Allergy:  Allergies  Allergen Reactions  . Hydrocodone Itching    vicodin  . Sulfa Antibiotics Rash    Really Sick on the stomach and itching  . Latex Hives and Itching  . Tetracyclines & Related Hives and Itching    Past Surgical Hx:  Past Surgical History:  Procedure Laterality Date  . ANKLE FUSION Left 2003   retained hardware  . CARDIOVASCULAR STRESS TEST  11-14-2016   dr berry   normal  nuclear study w/ no ischemia/  normal LV function and wall motion, nuclear stress ef 50%  . CERVICAL CONIZATION W/BX N/A 11/18/2017   Procedure: COLD KNIFE CONIZATION OF THE CERVIX WITH ENDOCERVICAL CURETTAGE;  Surgeon: [name], MD;  Location: [place];  Service: Gynecology;  Laterality: N/A;  . CESAREAN SECTION WITH BILATERAL TUBAL LIGATION  04-18-2001     dr Raphael Gibney  [facility]  . INGUINAL HERNIA REPAIR Left 1980  . KNEE SURGERY Bilateral x7 left , last one 2006:  x2 right , last one 1998   per pt involving muscle, tendon, ligament repair's  . LAPAROSCOPIC CHOLECYSTECTOMY  2000  . ROTATOR CUFF REPAIR Right 2004  . TRANSTHORACIC ECHOCARDIOGRAM  11-18-2016   dr berry   ef 55-60%,  grade 2 diastolic dysfunction/  trivial PR    Past Medical Hx:  Past Medical History:  Diagnosis Date  . ADHD   . Allergic rhinitis, seasonal   . Arthritis    knees, ankles  . Cervical dysplasia   . Chronic back pain   . GERD (gastroesophageal reflux disease)   . History of chest pain     evaluation by cardiologist, dr Gwenlyn Found, 11-08-2016 note in epic--  normal nuclear stress test and echo  . Hypertension   . Iron deficiency anemia   . Mixed hyperlipidemia   . Neuropathy   . PONV (postoperative nausea and vomiting)   . Right nephrolithiasis 08/2016  . Type 2 diabetes mellitus ([place])    followed by pcp  . Wears glasses     Past Gynecological History:   GYNECOLOGIC HISTORY:  No LMP recorded. Menarche: 52 years old P 3 LMP 2 weeks ago, ongoing Contraceptive none HRT none  Last Pap thinks 5 years ago, prior to current referral Pap 2019 - SCCa  Family Hx:  Family History  Problem Relation Age of Onset  . Throat cancer Father   . Hypertension Brother   . Throat cancer Paternal Aunt   . Throat cancer Cousin     Social Hx:  Tobacco use: never Alcohol use: 1 glass of wine every other week Illicit Drug use: none Illicit IV Drug use: none  Review of Systems:  Review of  Systems  All other systems reviewed and are negative. Vaginal discharge (minimal)  Vitals:  Blood pressure (!) 142/96, pulse (!) 117, temperature 98 F (36.7 C), temperature source Oral, resp. rate 20, height 5\' 9"  (1.753 m), weight 258 lb 1.6 oz (117.1 kg), SpO2 98 %. Body mass index is 38.11 kg/m.   Physical Exam: General :  Well developed, 52 y.o., female in no apparent distress HEENT:  Normocephalic/atraumatic, symmetric, EOMI, eyelids normal Neck:   No visible masses.  Respiratory:  Respirations unlabored, no use of accessory muscles CV:   Deferred Breast:  Deferred Musculoskeletal: Normal muscle strength. Abdomen:  No visible masses or protrusion Extremities:  No visible edema or deformities Skin:   Normal inspection Neuro/Psych:  No focal motor deficit, no abnormal mental status. Normal gait. Normal affect. Alert  and oriented to person, place, and time     Assessment Cervical CIN3  Plan: 1. Discussed preinvasive nature of the pathology 2. Recommend we reassess the volume of cervical tissue before deciding on hysterectomy versus observation 3. RTC ~6 weeks to reassess with pelvic 4. Activity restriction reviewed.  [name], MD  12/03/2017, 7:17 PM    Cc: Thurnell Lose, MD (Referring Ob/Gyn) [facility], [place] (PCP)

## 2017-12-12 ENCOUNTER — Ambulatory Visit
Admission: RE | Admit: 2017-12-12 | Discharge: 2017-12-12 | Disposition: A | Discharge status: Home / Self Care | Payer: BLUE CROSS/BLUE SHIELD | Source: Ambulatory Visit | Attending: Internal Medicine | Admitting: Internal Medicine

## 2017-12-12 DIAGNOSIS — Z1231 Encounter for screening mammogram for malignant neoplasm of breast: Secondary | ICD-10-CM

## 2018-01-10 NOTE — Progress Notes (Signed)
[place] at [facility]  Progress Note: Established Patient Follow-up  Consult was originally requested by Dr. Thurnell Lose for a Pap concerning for carcinoma  Chief Complaint  Patient presents with  . Cervical dysplasia    HPI: Ms. Miranda Lee  is a very nice 52 y.o.  P3  . Interval History She returns today to reassess since undergoing the cone 11/18/17. We are trying to see what volume of cervical tissue remains before making decision about hysterectomy versus returning to continued screening protocol.  She is noting some worsening neck pain and now hip pain since her last visit. Her PCP has referred her to orthopedics/spine surgery.  . Presenting History She noted a left axillary/breast mass and scheduled an appointment with Dr.Varnado. 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  She was therefore referred for management.  The patient thinks her last Pap was within the past 5 years, with her PCP, but is not sure of exact date. She thought she was uptodate because the recommendations had changed to be every 3-5 years.   She noted postcoital spotting over several months 2-3 times. She had some dysparunia with deep penetration. Her menstrual cycle has been regular until the last 3 months. She missed a cycle 3 months ago and then the most recent cycle came 2 weeks early. She feels a dull pressure on the right pelvis/groin. She notes chronic back pain. Has noted some neck pain the past 3 months. Notes hot flashes regularly.   She underwent cervical conization and ECC on 11/18/17. There were no perioperative complications.  Final pathology revealed: 1. Cervix, cone - HIGH GRADE SQUAMOUS INTRAEPITHELIAL LESION EXTENDING INTO ENDOCERVICAL GLANDS(CIN 2-3; MODERATE TO SEVERE DYSPLASIA) - MARGINS UNINVOLVED BY DYSPLASIA 2. 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  --> Allergy to steristrips.  Imported EPIC Oncologic History:    No history exists.    Measurement of disease:  N/a  Radiology: . Has CT AP from 2009 in [facility] system  Current Meds:  Outpatient Encounter Medications as of 01/12/2018  Medication Sig  . amitriptyline (ELAVIL) 10 MG tablet 1 TABLET 1-3 HOURS BEFORE BEDTIME ONCE A DAY ORALLY 30 DAY(S)  . amphetamine-dextroamphetamine (ADDERALL) 10 MG tablet Take 10 mg by mouth every evening. Per pt takes at 1300 daily  . celecoxib (CELEBREX) 100 MG capsule TAKE 1 CAPSULE BY MOUTH EVERY DAY WITH FOOD---takes in am  . Cholecalciferol (VITAMIN D3) 5000 units CAPS Take 1 capsule by mouth daily.  . DULoxetine (CYMBALTA) 60 MG capsule Take 2 capsules by mouth at bedtime.   . Fe Fum-FePoly-Vit C-Vit B3 (INTEGRA) 62.5-62.5-40-3 MG CAPS Take 1 tablet by mouth daily.  . hydrochlorothiazide (HYDRODIURIL) 25 MG tablet Take 25 mg by mouth every morning.   Marland Kitchen lisinopril (PRINIVIL,ZESTRIL) 10 MG tablet Take 10 mg by mouth every morning.   . metFORMIN (GLUMETZA) 1000 MG (MOD) 24 hr tablet Take 1,000 mg by mouth 2 (two) times daily with a meal.   . methylphenidate 36 MG PO CR tablet Take 2 tablets by mouth every morning. CONCERTA--  Name brand  . orphenadrine (NORFLEX) 100 MG tablet TAKE 1 TABLET AT BEDTIME TWICE A DAY AS NEEDED  . pantoprazole (PROTONIX) 20 MG tablet Take 20 mg by mouth every morning.   . pravastatin (PRAVACHOL) 40 MG tablet  Take 40 mg by mouth every evening.   . pregabalin (LYRICA) 75 MG capsule    No facility-administered encounter medications on file as of 01/12/2018.     Allergy:  Allergies  Allergen Reactions  . Hydrocodone Itching    vicodin  . Sulfa Antibiotics Rash    Really Sick on the stomach and itching  . Latex Hives and Itching  . Tetracyclines & Related Hives and Itching    Past  Surgical Hx:  Past Surgical History:  Procedure Laterality Date  . ANKLE FUSION Left 2003   retained hardware  . CARDIOVASCULAR STRESS TEST  11-14-2016   dr berry   normal nuclear study w/ no ischemia/  normal LV function and wall motion, nuclear stress ef 50%  . CERVICAL CONIZATION W/BX N/A 11/18/2017   Procedure: COLD KNIFE CONIZATION OF THE CERVIX WITH ENDOCERVICAL CURETTAGE;  Surgeon: [name], MD;  Location: [place];  Service: Gynecology;  Laterality: N/A;  . CESAREAN SECTION WITH BILATERAL TUBAL LIGATION  04-18-2001     dr Raphael Gibney  [facility]  . INGUINAL HERNIA REPAIR Left 1980  . KNEE SURGERY Bilateral x7 left , last one 2006:  x2 right , last one 1998   per pt involving muscle, tendon, ligament repair's  . LAPAROSCOPIC CHOLECYSTECTOMY  2000  . ROTATOR CUFF REPAIR Right 2004  . TRANSTHORACIC ECHOCARDIOGRAM  11-18-2016   dr berry   ef 55-60%,  grade 2 diastolic dysfunction/  trivial PR    Past Medical Hx:  Past Medical History:  Diagnosis Date  . ADHD   . Allergic rhinitis, seasonal   . Arthritis    knees, ankles  . Arthritis   . Cervical dysplasia   . Chronic back pain   . Degenerative disc disease, cervical    neck  . Dental root cyst 2019  . GERD (gastroesophageal reflux disease)   . History of chest pain     evaluation by cardiologist, dr Gwenlyn Found, 11-08-2016 note in epic--  normal nuclear stress test and echo  . Hypertension   . Iron deficiency anemia   . Mixed hyperlipidemia   . Neuropathy   . PONV (postoperative nausea and vomiting)   . Right nephrolithiasis 08/2016  . Type 2 diabetes mellitus ([place])    followed by pcp  . Wears glasses     Past Gynecological History:   GYNECOLOGIC HISTORY:  No LMP recorded. Menarche: 52 years old P 3 LMP finished approx 01/07/18 Contraceptive none HRT none  Last Pap thinks 5 years ago, prior to current referral Pap 2019 - SCCa  Family Hx:  Family History  Problem Relation Age of Onset  . Throat  cancer Father   . Hypertension Brother   . Throat cancer Paternal Aunt   . Throat cancer Cousin     Social Hx:  Tobacco use: never Alcohol use: 1 glass of wine every other week Illicit Drug use: none Illicit IV Drug use: none  Review of Systems:  Review of Systems - Oncology   Vitals:  Blood pressure 125/79, pulse 97, temperature 98.9 F (37.2 C), temperature source Oral, resp. rate 18, height 5\' 9"  (1.753 m), weight 255 lb 8 oz (115.9 kg), SpO2 97 %. Body mass index is 37.73 kg/m.   Physical Exam: General :  Well developed, 52 y.o., female in no apparent distress HEENT:  Normocephalic/atraumatic, symmetric, EOMI, eyelids normal Neck:   No visible masses.  Respiratory:  Respirations unlabored, no use of accessory muscles CV:   Deferred Breast:  Deferred Musculoskeletal: Normal muscle strength. Abdomen:  Obese No visible masses or protrusion Extremities:  No visible edema or deformities Skin:   Normal inspection Neuro/Psych:  No focal motor deficit, no abnormal mental status. Normal gait. Normal affect. Alert and oriented to person, place, and time  Genito Urinary: Vulva: Normal external female genitalia.  Bladder/urethra: Urethral meatus normal in size and location. No lesions or   masses, well supported bladder Speculum exam: Vagina: No lesion, no discharge, no bleeding. Cervix: Inferior border has less ectocervix than the other regions, but other than the 6-8:00 area the ectocervix appears normal. Os is visible. No lesions. Bimanual exam:  Uterus: Difficult to delineate given habitus, mobile.  Adnexa: No masses.  Rectovaginal:  Deferred   Assessment Cervical CIN3  Plan: 1. We have previously discussed the preinvasive nature of the pathology 2. She has adequate cervical tissue remaining for continued observation 3. I will see her back in 6 months for Pap/ECC and if that is normal dispo her back to Dr. Simona Huh 4. Dysuria - UA sent   15 minutes of direct face to  face counseling time was spent with the patient. This included discussion about followup recommendations and assessment beyond the scope of routine postoperative care after a cone biopsy.   [name], MD  01/12/2018, 11:25 AM    Cc: Thurnell Lose, MD (Referring Ob/Gyn) [facility], [place] (PCP)

## 2018-01-12 ENCOUNTER — Inpatient Hospital Stay: Payer: BLUE CROSS/BLUE SHIELD

## 2018-01-12 ENCOUNTER — Encounter: Payer: Self-pay | Admitting: Obstetrics

## 2018-01-12 ENCOUNTER — Inpatient Hospital Stay: Payer: BLUE CROSS/BLUE SHIELD | Attending: Obstetrics | Admitting: Obstetrics

## 2018-01-12 VITALS — BP 125/79 | HR 97 | Temp 98.9°F | Resp 18 | Ht 69.0 in | Wt 255.5 lb

## 2018-01-12 DIAGNOSIS — R300 Dysuria: Secondary | ICD-10-CM | POA: Diagnosis not present

## 2018-01-12 DIAGNOSIS — N879 Dysplasia of cervix uteri, unspecified: Secondary | ICD-10-CM | POA: Diagnosis present

## 2018-01-12 LAB — URINALYSIS, COMPLETE (UACMP) WITH MICROSCOPIC
Bacteria, UA: NONE SEEN
Bilirubin Urine: NEGATIVE
Glucose, UA: >=500 mg/dL — AB
Ketones, ur: 5 mg/dL — AB
Leukocytes, UA: NEGATIVE
Nitrite: NEGATIVE
Protein, ur: NEGATIVE mg/dL
RBC / HPF: >50 RBC/hpf — ABNORMAL HIGH (ref 0–5)
Specific Gravity, Urine: 1.026 (ref 1.005–1.030)
pH: 5 (ref 5.0–8.0)

## 2018-01-12 NOTE — Patient Instructions (Addendum)
Return mid January for a Pap ECC and pelvic. Call the office in November at [phone] to schedule appointment.

## 2018-01-13 LAB — URINE CULTURE: Culture: NO GROWTH

## 2018-04-08 ENCOUNTER — Telehealth: Payer: Self-pay

## 2018-04-08 NOTE — Telephone Encounter (Signed)
Returned pt's call regarding she wants to make her f/u appt but appt is not until March and she is having hip replacement surgery and wants to make sure everything is ok -since she is having spotty bleeding in between periods for the last 2 cycles.  Per Joylene John NP - ok to move her appt up.  Appt made for next Wed Dec 11th at 3:45 pm.  Pt voiced understanding and pt will contact us if any worsening symptoms before her appt. No other needs per pt at this time.

## 2018-04-15 ENCOUNTER — Inpatient Hospital Stay: Payer: BLUE CROSS/BLUE SHIELD | Attending: Obstetrics | Admitting: Obstetrics

## 2018-04-15 ENCOUNTER — Encounter: Payer: Self-pay | Admitting: Obstetrics

## 2018-04-15 VITALS — BP 160/96 | HR 100 | Temp 98.7°F | Resp 16 | Ht 69.0 in | Wt 263.0 lb

## 2018-04-15 DIAGNOSIS — N939 Abnormal uterine and vaginal bleeding, unspecified: Secondary | ICD-10-CM | POA: Insufficient documentation

## 2018-04-15 DIAGNOSIS — N879 Dysplasia of cervix uteri, unspecified: Secondary | ICD-10-CM | POA: Insufficient documentation

## 2018-04-15 NOTE — Progress Notes (Signed)
PROCEDURE NOTE  Endometrial Biopsy  The procedure was explained and verbal consent given. She declined a pregnancy test as she has had a tubal ligation.   The speculum was placed and the cervix was prepped with betadine.  A tenaculum was placed on the anterior lip of the cervix.   The os was cannulated and the endometrial pipelle inserted ~8cm. adequate tissue was obtained. The tenaculum was removed from the cervix. Hemostasis was obtained with pressure and time. The patient tolerated procedure well.

## 2018-04-15 NOTE — Addendum Note (Signed)
Addended by: Joylene John D on: 04/15/2018 05:00 PM   Modules accepted: Orders

## 2018-04-15 NOTE — Patient Instructions (Addendum)
Keep followup in 2020 as scheduled. We will let you know the biopsy results. If you have not heard in 2 weeks call us.

## 2018-04-15 NOTE — Progress Notes (Signed)
[place]  Progress Note: Established Patient Follow-up  Consult was originally requested by Dr. Thurnell Lose for a Pap concerning for carcinoma  Chief Complaint  Patient presents with  . Cervical dysplasia  s/p cervical cone 11/18/17  HPI: Miranda Lee  is a very nice 52 y.o.  P3  . Interval History She is now scheduled for hip surgery the end of the month.  She notes her menstrual cycle has changed with prolonged spotting after her menses for the past 2 cycles. She is also feeling pelvic heaviness which she had not noted for many years.  . Presenting History She noted a left axillary/breast mass and scheduled an appointment with Dr.Varnado. 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  She was therefore referred for management.  The patient thinks her last Pap was within the past 5 years, with her PCP, but is not sure of exact date. She thought she was uptodate because the recommendations had changed to be every 3-5 years.   She noted postcoital spotting over several months 2-3 times. She had some dysparunia with deep penetration. Her menstrual cycle has been regular until the last 3 months. She missed a cycle 3 months ago and then the most recent cycle came 2 weeks early. She feels a dull pressure on the right pelvis/groin. She notes chronic back pain. Has noted some neck pain the past 3 months. Notes hot flashes regularly.   She underwent cervical conization and ECC on 11/18/17. There were no perioperative complications.  Final pathology revealed: 1. Cervix, cone - HIGH GRADE SQUAMOUS INTRAEPITHELIAL LESION EXTENDING INTO ENDOCERVICAL GLANDS(CIN 2-3; MODERATE TO SEVERE DYSPLASIA) - MARGINS UNINVOLVED BY DYSPLASIA 2. 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  --> Allergy to steristrips.  Imported EPIC Oncologic History:    No history exists.    Measurement of disease:  N/a  Radiology: . Has CT AP from 2009 in [facility] system  Current Meds:  Outpatient Encounter Medications as of 04/15/2018  Medication Sig  . amphetamine-dextroamphetamine (ADDERALL) 10 MG tablet Take 10 mg by mouth every evening. Per pt takes at 1300 daily  . celecoxib (CELEBREX) 100 MG capsule TAKE 1 CAPSULE BY MOUTH EVERY DAY WITH FOOD---takes in am  . Cholecalciferol (VITAMIN D3) 5000 units CAPS Take 1 capsule by mouth daily.  . DULoxetine (CYMBALTA) 60 MG capsule Take 2 capsules by mouth at bedtime.   . Fe Fum-FePoly-Vit C-Vit B3 (INTEGRA) 62.5-62.5-40-3 MG CAPS Take 1 tablet by mouth daily.  . hydrochlorothiazide (HYDRODIURIL) 25 MG tablet Take 25 mg by mouth every morning.   Marland Kitchen lisinopril (PRINIVIL,ZESTRIL) 10 MG tablet Take 10 mg by mouth every morning.   . metFORMIN (GLUMETZA) 1000 MG (MOD) 24 hr tablet Take 1,000 mg by mouth 2 (two) times daily with a meal.   . methylphenidate 36 MG PO CR tablet Take 2 tablets by mouth every morning. CONCERTA--  Name brand  . orphenadrine (NORFLEX) 100 MG tablet TAKE 1 TABLET AT BEDTIME TWICE A DAY AS NEEDED  . pantoprazole (PROTONIX) 20 MG tablet Take 20 mg by mouth every morning.   . pravastatin (PRAVACHOL) 40 MG tablet Take 40 mg by mouth every evening.   . pregabalin (LYRICA) 75 MG capsule   . [DISCONTINUED] amitriptyline (ELAVIL) 10 MG tablet 1 TABLET 1-3  HOURS BEFORE BEDTIME ONCE A DAY ORALLY 30 DAY(S)   No facility-administered encounter medications on file as of 04/15/2018.     Allergy:  Allergies  Allergen Reactions  . Hydrocodone Itching    vicodin  . Sulfa Antibiotics Rash    Really Sick on the stomach and itching  . Latex Hives and Itching  . Tetracyclines & Related Hives and Itching    Past Surgical Hx:  Past Surgical History:  Procedure Laterality Date  .  ANKLE FUSION Left 2003   retained hardware  . CARDIOVASCULAR STRESS TEST  11-14-2016   dr berry   normal nuclear study w/ no ischemia/  normal LV function and wall motion, nuclear stress ef 50%  . CERVICAL CONIZATION W/BX N/A 11/18/2017   Procedure: COLD KNIFE CONIZATION OF THE CERVIX WITH ENDOCERVICAL CURETTAGE;  Surgeon: [name], MD;  Location: [place];  Service: Gynecology;  Laterality: N/A;  . CESAREAN SECTION WITH BILATERAL TUBAL LIGATION  04-18-2001     dr Raphael Gibney  [facility]  . INGUINAL HERNIA REPAIR Left 1980  . KNEE SURGERY Bilateral x7 left , last one 2006:  x2 right , last one 1998   per pt involving muscle, tendon, ligament repair's  . LAPAROSCOPIC CHOLECYSTECTOMY  2000  . ROTATOR CUFF REPAIR Right 2004  . TRANSTHORACIC ECHOCARDIOGRAM  11-18-2016   dr berry   ef 55-60%,  grade 2 diastolic dysfunction/  trivial PR    Past Medical Hx:  Past Medical History:  Diagnosis Date  . ADHD   . Allergic rhinitis, seasonal   . Arthritis    knees, ankles  . Arthritis   . Cervical dysplasia   . Chronic back pain   . Degenerative disc disease, cervical    neck  . Dental root cyst 2019  . GERD (gastroesophageal reflux disease)   . History of chest pain     evaluation by cardiologist, dr Gwenlyn Found, 11-08-2016 note in epic--  normal nuclear stress test and echo  . Hypertension   . Iron deficiency anemia   . Mixed hyperlipidemia   . Neuropathy   . PONV (postoperative nausea and vomiting)   . Right nephrolithiasis 08/2016  . Type 2 diabetes mellitus ([place])    followed by pcp  . Wears glasses     Past Gynecological History:   GYNECOLOGIC HISTORY:  No LMP recorded. Menarche: 52 years old P 3 LMP finished approx 01/07/18 Contraceptive none HRT none  Last Pap thinks 5 years ago, prior to current referral Pap 2019 - SCCa  Family Hx:  Family History  Problem Relation Age of Onset  . Throat cancer Father   . Hypertension Brother   . Throat cancer Paternal Aunt    . Throat cancer Cousin     Social Hx:  Tobacco use: never Alcohol use: 1 glass of wine every other week Illicit Drug use: none Illicit IV Drug use: none  Review of Systems:  Review of Systems  Endocrine: Positive for hot flashes.  Genitourinary: Positive for frequency and vaginal bleeding.   Musculoskeletal: Positive for arthralgias, back pain and myalgias.  All other systems reviewed and are negative.    Vitals:  Blood pressure (!) 160/96, pulse 100, temperature 98.7 F (37.1 C), temperature source Oral, resp. rate 16, height 5\' 9"  (1.753 m), weight 263 lb (119.3 kg), SpO2 100 %. Body mass index is 38.84 kg/m.   Physical Exam: General :  Well developed, 52 y.o., female in no apparent distress HEENT:  Normocephalic/atraumatic, symmetric, EOMI,  eyelids normal Neck:   No visible masses.  Respiratory:  Respirations unlabored, no use of accessory muscles CV:   Deferred Breast:  Deferred Musculoskeletal: Normal muscle strength. Abdomen:  Obese No visible masses or protrusion Extremities:  No visible edema or deformities Skin:   Normal inspection Neuro/Psych:  No focal motor deficit, no abnormal mental status. Normal gait. Normal affect. Alert and oriented to person, place, and time  Genito Urinary: Vulva: Normal external female genitalia.  Bladder/urethra: Urethral meatus normal in size and location. No lesions or   masses, well supported bladder Speculum exam: Vagina: No lesion, no discharge, no bleeding. Cervix: Inferior border has less ectocervix (6-8:00) than the other regions, but otherwise the cervix appears normal. Os is visible. No lesions. Bimanual exam:  Uterus: Difficult to delineate given habitus, mobile.  Adnexa: No masses.  Rectovaginal:  Deferred   Assessment Cervical CIN3 Now AUB  Plan: 1. Cleared for ortho surgery pending biopsy  2. We have previously discussed the preinvasive nature of the pathology on her cervix 3. She has adequate cervical  tissue remaining for continued observation 4. I will see her back as scheduled (6 months from her cone for Pap/ECC) -- and if that is normal dispo her back to Dr. Simona Huh 5. EMB done today given the AUB   15 minutes of direct face to face counseling time was spent with the patient. This included discussion about followup recommendations and assessment beyond the scope of routine postoperative care after a cone biopsy.   [name], MD  04/15/2018, 4:45 PM    Cc: Thurnell Lose, MD (Referring Ob/Gyn) [facility] [place] (PCP)

## 2018-04-17 ENCOUNTER — Telehealth: Payer: Self-pay | Admitting: *Deleted

## 2018-04-17 NOTE — Telephone Encounter (Signed)
Called to inform patient of her pap smear results from 04/15/18. Per Joylene John, NP no cancer seen. Patient was very appreciative and verbalized understanding.

## 2018-05-22 ENCOUNTER — Inpatient Hospital Stay: Payer: BLUE CROSS/BLUE SHIELD | Attending: Obstetrics | Admitting: Obstetrics

## 2018-08-12 ENCOUNTER — Telehealth: Payer: Self-pay | Admitting: *Deleted

## 2018-08-12 NOTE — Telephone Encounter (Signed)
Called and scheduled the patient for an appt on 5/19 with Dr. Alycia Rossetti

## 2018-09-08 ENCOUNTER — Telehealth: Payer: Self-pay | Admitting: *Deleted

## 2018-09-08 NOTE — Telephone Encounter (Signed)
Called and moved the patient's appt from 5/19 to 5/13

## 2018-09-15 ENCOUNTER — Telehealth: Payer: Self-pay | Admitting: *Deleted

## 2018-09-15 NOTE — Telephone Encounter (Signed)
Called and spoke with the patient regarding her appt for tomorrow. Patient has no signs/symptoms, travel or exposure. Explained the check in process, new parking process, and the mask/no visitor policy.

## 2018-09-16 ENCOUNTER — Other Ambulatory Visit: Payer: Self-pay | Admitting: Gynecologic Oncology

## 2018-09-16 ENCOUNTER — Inpatient Hospital Stay: Payer: BLUE CROSS/BLUE SHIELD | Attending: Gynecologic Oncology | Admitting: Gynecologic Oncology

## 2018-09-16 DIAGNOSIS — N879 Dysplasia of cervix uteri, unspecified: Secondary | ICD-10-CM

## 2018-09-16 NOTE — Progress Notes (Deleted)
Consult Note: Gyn-Onc  Miranda Lee 53 y.o. female with a history of cervical dysplasia.  She comes in today for a Pap smear.  I will notify her of the results from today.  We will determine her disposition pending these results more than likely she will return to see [place] in 6 months for repeat Pap smear.  If the subsequent 2 Pap smears are normal she can be returned to Dr. Simona Huh for continued follow-up.  CC: No chief complaint on file.   HPI: Consult was originally requested by Dr. Thurnell Lose for a Pap concerning for carcinoma     Chief Complaint  Patient presents with  . Cervical dysplasia  s/p cervical cone 11/18/17  HPI: Miranda Lee  is a very nice 53 y.o.  P3   Interval History She is now scheduled for hip surgery the end of the month.  She notes her menstrual cycle has changed with prolonged spotting after her menses for the past 2 cycles. She is also feeling pelvic heaviness which she had not noted for many years.   Presenting History She noted a left axillary/breast mass and scheduled an appointment with Dr.Varnado. 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  She was therefore referred for management.  The patient thinks her last Pap was within the past 5 years, with her PCP, but is not sure of exact date. She thought she was uptodate because the recommendations had changed to be every 3-5 years.   She noted postcoital spotting over several months 2-3 times. She had some dysparunia with deep penetration. Her menstrual cycle has been regular until the last 3 months. She missed a cycle 3 months ago and then the most recent cycle came 2 weeks early. She feels a dull pressure on the right pelvis/groin. She notes chronic back pain. Has noted some neck pain the past 3 months. Notes hot flashes regularly.   She underwent cervical conization and ECC on  11/18/17. There were no perioperative complications.  Final pathology revealed: 1. Cervix, cone - HIGH GRADE SQUAMOUS INTRAEPITHELIAL LESION EXTENDING INTO ENDOCERVICAL GLANDS(CIN 2-3; MODERATE TO SEVERE DYSPLASIA) - MARGINS UNINVOLVED BY DYSPLASIA 2. Endocervix, curettage - BENIGN ENDOCERVICAL GLANDULAR TISSUE - NO MALIGNANCY IDENTIFIED  Interval History:  She was last seen by Dr. Gerarda Fraction in December 2019.  At that time she had a endometrial biopsy that revealed proliferative endometrium.  She comes in today for follow-up pap smear.  Review of Systems  Current Meds:  Outpatient Encounter Medications as of 09/16/2018  Medication Sig  . amphetamine-dextroamphetamine (ADDERALL) 10 MG tablet Take 10 mg by mouth every evening. Per pt takes at 1300 daily  . celecoxib (CELEBREX) 100 MG capsule TAKE 1 CAPSULE BY MOUTH EVERY DAY WITH FOOD---takes in am  . Cholecalciferol (VITAMIN D3) 5000 units CAPS Take 1 capsule by mouth daily.  . DULoxetine (CYMBALTA) 60 MG capsule Take 2 capsules by mouth at bedtime.   . Fe Fum-FePoly-Vit C-Vit B3 (INTEGRA) 62.5-62.5-40-3 MG CAPS Take 1 tablet by mouth daily.  . hydrochlorothiazide (HYDRODIURIL) 25 MG tablet Take 25 mg by mouth every morning.   Marland Kitchen lisinopril (PRINIVIL,ZESTRIL) 10 MG tablet Take 10 mg by mouth every morning.   . metFORMIN (GLUMETZA) 1000 MG (MOD) 24 hr tablet Take 1,000 mg by mouth 2 (two) times daily with a meal.   . methylphenidate 36 MG PO CR tablet Take 2 tablets by mouth every  morning. CONCERTA--  Name brand  . orphenadrine (NORFLEX) 100 MG tablet TAKE 1 TABLET AT BEDTIME TWICE A DAY AS NEEDED  . pantoprazole (PROTONIX) 20 MG tablet Take 20 mg by mouth every morning.   . pravastatin (PRAVACHOL) 40 MG tablet Take 40 mg by mouth every evening.   . pregabalin (LYRICA) 75 MG capsule    No facility-administered encounter medications on file as of 09/16/2018.     Allergy:  Allergies  Allergen Reactions  . Hydrocodone Itching    vicodin  .  Sulfa Antibiotics Rash    Really Sick on the stomach and itching  . Latex Hives and Itching  . Tetracyclines & Related Hives and Itching    Social Hx:   Social History   Socioeconomic History  . Marital status: Married    Spouse name: Not on file  . Number of children: Not on file  . Years of education: Not on file  . Highest education level: Not on file  Occupational History  . Not on file  Social Needs  . Financial resource strain: Not on file  . Food insecurity:    Worry: Not on file    Inability: Not on file  . Transportation needs:    Medical: Not on file    Non-medical: Not on file  Tobacco Use  . Smoking status: Never Smoker  . Smokeless tobacco: Never Used  Substance and Sexual Activity  . Alcohol use: Yes    Comment: Ocassional  . Drug use: Never  . Sexual activity: Not on file  Lifestyle  . Physical activity:    Days per week: Not on file    Minutes per session: Not on file  . Stress: Not on file  Relationships  . Social connections:    Talks on phone: Not on file    Gets together: Not on file    Attends religious service: Not on file    Active member of club or organization: Not on file    Attends meetings of clubs or organizations: Not on file    Relationship status: Not on file  . Intimate partner violence:    Fear of current or ex partner: Not on file    Emotionally abused: Not on file    Physically abused: Not on file    Forced sexual activity: Not on file  Other Topics Concern  . Not on file  Social History Narrative  . Not on file    Past Surgical Hx:  Past Surgical History:  Procedure Laterality Date  . ANKLE FUSION Left 2003   retained hardware  . CARDIOVASCULAR STRESS TEST  11-14-2016   dr berry   normal nuclear study w/ no ischemia/  normal LV function and wall motion, nuclear stress ef 50%  . CERVICAL CONIZATION W/BX N/A 11/18/2017   Procedure: COLD KNIFE CONIZATION OF THE CERVIX WITH ENDOCERVICAL CURETTAGE;  Surgeon: [name], MD;  Location: [place];  Service: Gynecology;  Laterality: N/A;  . CESAREAN SECTION WITH BILATERAL TUBAL LIGATION  04-18-2001     dr Raphael Gibney  [facility]  . INGUINAL HERNIA REPAIR Left 1980  . KNEE SURGERY Bilateral x7 left , last one 2006:  x2 right , last one 1998   per pt involving muscle, tendon, ligament repair's  . LAPAROSCOPIC CHOLECYSTECTOMY  2000  . ROTATOR CUFF REPAIR Right 2004  . TRANSTHORACIC ECHOCARDIOGRAM  11-18-2016   dr berry   ef 55-60%,  grade 2 diastolic dysfunction/  trivial PR  Past Medical Hx:  Past Medical History:  Diagnosis Date  . ADHD   . Allergic rhinitis, seasonal   . Arthritis    knees, ankles  . Arthritis   . Cervical dysplasia   . Chronic back pain   . Degenerative disc disease, cervical    neck  . Dental root cyst 2019  . GERD (gastroesophageal reflux disease)   . History of chest pain     evaluation by cardiologist, dr Gwenlyn Found, 11-08-2016 note in epic--  normal nuclear stress test and echo  . Hypertension   . Iron deficiency anemia   . Mixed hyperlipidemia   . Neuropathy   . PONV (postoperative nausea and vomiting)   . Right nephrolithiasis 08/2016  . Type 2 diabetes mellitus ([place])    followed by pcp  . Wears glasses     Oncology Hx:   No history exists.    Family Hx:  Family History  Problem Relation Age of Onset  . Throat cancer Father   . Hypertension Brother   . Throat cancer Paternal Aunt   . Throat cancer Cousin     Vitals:  There were no vitals taken for this visit.  Physical Exam:   Assessment/Plan:   Nyzaiah Kai A., MD 09/16/2018, 8:41 AM

## 2018-09-16 NOTE — Progress Notes (Unsigned)
lab5

## 2018-09-22 ENCOUNTER — Ambulatory Visit: Payer: BLUE CROSS/BLUE SHIELD | Admitting: Gynecologic Oncology

## 2018-10-21 ENCOUNTER — Telehealth: Payer: Self-pay | Admitting: *Deleted

## 2018-10-21 NOTE — Telephone Encounter (Signed)
Returned the patient's call and scheduled her to see Dr. Alycia Rossetti in July. Patient asked to be put a on a waiting list to come in earlier.

## 2018-11-01 IMAGING — MG DIGITAL SCREENING BILATERAL MAMMOGRAM WITH TOMO AND CAD
2 series · 2 of 10 positions shown · non-contrast
Comparison: None.

CLINICAL DATA: Screening.

EXAM:
DIGITAL SCREENING BILATERAL MAMMOGRAM WITH TOMO AND CAD

[L MLO tomo · tomo slice 41/80.0]
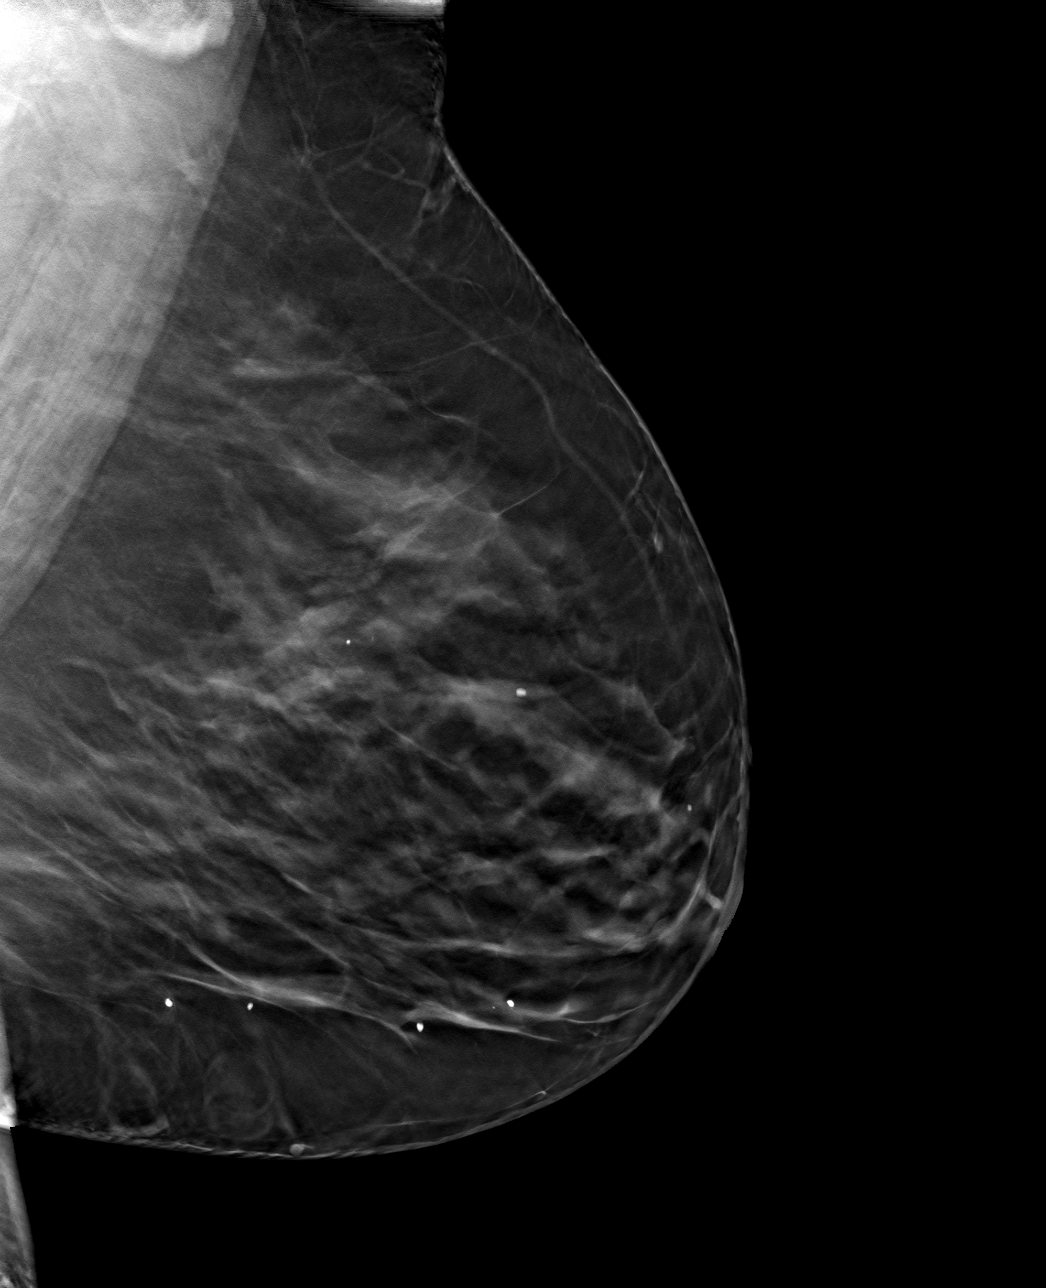

[R MLO tomo · tomo slice 41/82.0]
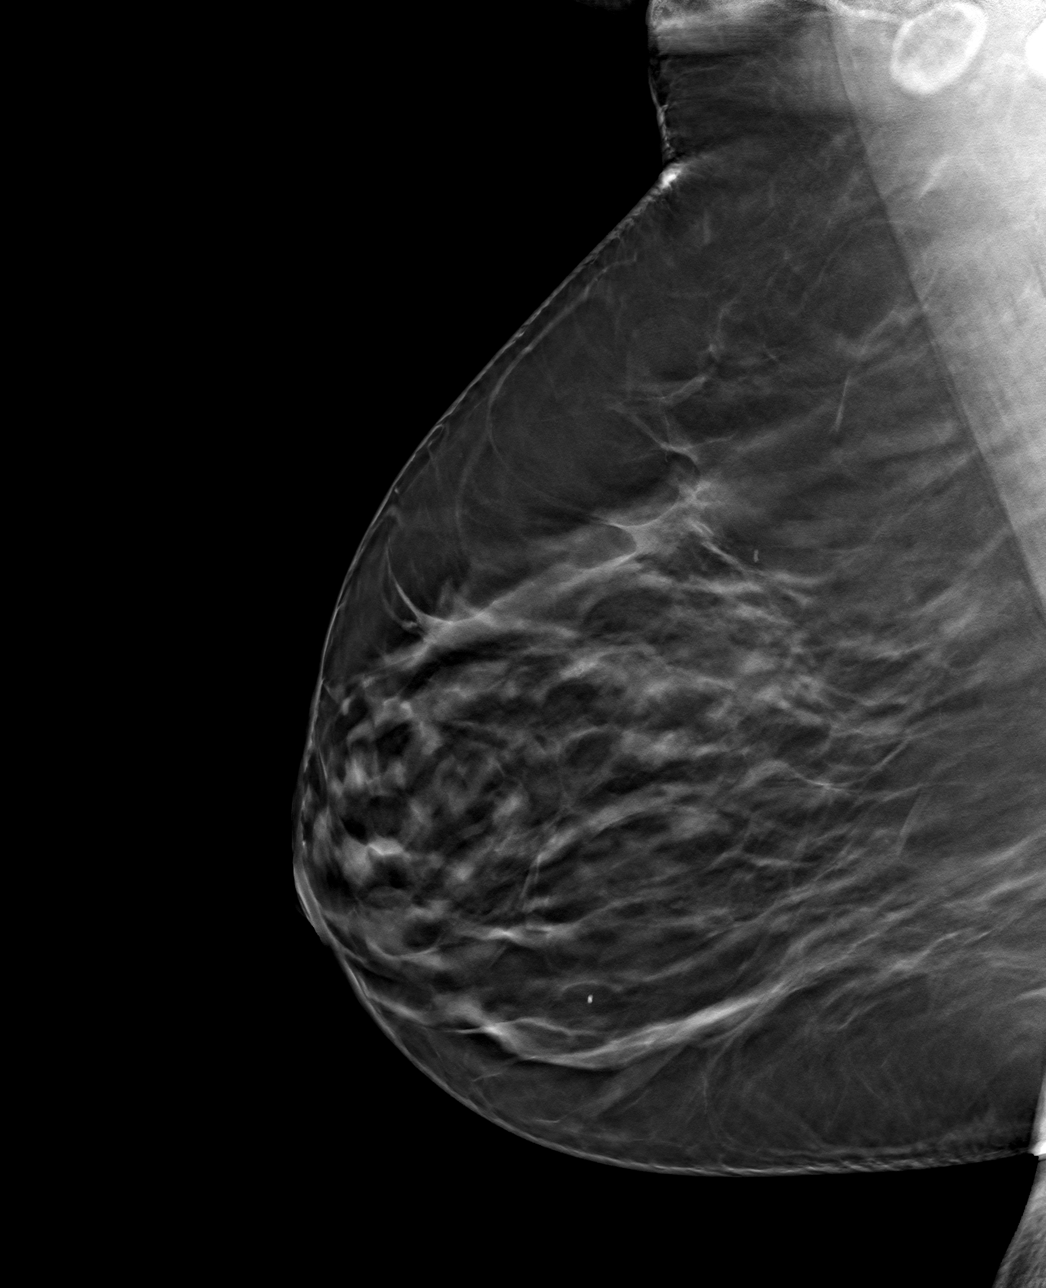

[2 of 10 positions shown; findings below may reference images not displayed]

ACR Breast Density Category c: The breast tissue is heterogeneously
dense, which may obscure small masses
FINDINGS: There are no findings suspicious for malignancy. Images were
processed with CAD.
IMPRESSION: No mammographic evidence of malignancy. A result letter of this
screening mammogram will be mailed directly to the patient.

RECOMMENDATION:
Screening mammogram in one year. (Code:EM-2-IHY)

BI-RADS CATEGORY  1: Negative.

## 2018-11-24 NOTE — Progress Notes (Deleted)
Consult Note: Gyn-Onc  Miranda Lee 53 y.o. female  CC: No chief complaint on file.   HPI:She noted a left axillary/breast mass and scheduled an appointment with Dr.Varnado. 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 HR-HPV was detected.  She was therefore referred for management.  The patient thinks her last Pap was within the past 5 years, with her PCP, but is not sure of exact date. She thought she was uptodate because the recommendations had changed to be every 3-5 years.   She noted postcoital spotting over several months 2-3 times. She had some dysparunia with deep penetration. Her menstrual cycle has been regular until the last 3 months. She missed a cycle 3 months ago and then the most recent cycle came 2 weeks early. She feels a dull pressure on the right pelvis/groin. She notes chronic back pain. Has noted some neck pain the past 3 months. Notes hot flashes regularly.   She underwent cervical conization and ECC on 11/18/17. There were no perioperative complications.  Final pathology revealed: 1. Cervix, cone - HIGH GRADE SQUAMOUS INTRAEPITHELIAL LESION EXTENDING INTO ENDOCERVICAL GLANDS(CIN 2-3; MODERATE TO SEVERE DYSPLASIA) - MARGINS UNINVOLVED BY DYSPLASIA 2. 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 Interval History:  She comes in today for repeat pap smear and per Dr. Gerarda Fraction' note, an ECC.  Review of Systems:  Current Meds:  Outpatient Encounter Medications as of 12/01/2018  Medication Sig  . amphetamine-dextroamphetamine (ADDERALL) 10 MG tablet Take 10 mg by mouth every evening. Per pt takes at 1300 daily  . celecoxib (CELEBREX) 100 MG capsule TAKE 1 CAPSULE BY MOUTH EVERY DAY WITH FOOD---takes in am  . Cholecalciferol (VITAMIN D3) 5000 units CAPS Take 1 capsule by mouth  daily.  . DULoxetine (CYMBALTA) 60 MG capsule Take 2 capsules by mouth at bedtime.   . Fe Fum-FePoly-Vit C-Vit B3 (INTEGRA) 62.5-62.5-40-3 MG CAPS Take 1 tablet by mouth daily.  . hydrochlorothiazide (HYDRODIURIL) 25 MG tablet Take 25 mg by mouth every morning.   Marland Kitchen lisinopril (PRINIVIL,ZESTRIL) 10 MG tablet Take 10 mg by mouth every morning.   . metFORMIN (GLUMETZA) 1000 MG (MOD) 24 hr tablet Take 1,000 mg by mouth 2 (two) times daily with a meal.   . methylphenidate 36 MG PO CR tablet Take 2 tablets by mouth every morning. CONCERTA--  Name brand  . orphenadrine (NORFLEX) 100 MG tablet TAKE 1 TABLET AT BEDTIME TWICE A DAY AS NEEDED  . pantoprazole (PROTONIX) 20 MG tablet Take 20 mg by mouth every morning.   . pravastatin (PRAVACHOL) 40 MG tablet Take 40 mg by mouth every evening.   . pregabalin (LYRICA) 75 MG capsule    No facility-administered encounter medications on file as of 12/01/2018.     Allergy:  Allergies  Allergen Reactions  . Hydrocodone Itching    vicodin  . Sulfa Antibiotics Rash    Really Sick on the stomach and itching  . Latex Hives and Itching  . Tetracyclines & Related Hives and Itching    Social Hx:   Social History   Socioeconomic History  . Marital status: Married    Spouse name: Not on file  . Number of children: Not on file  . Years of education: Not on file  . Highest education level: Not on file  Occupational History  . Not on file  Social Needs  .  Financial resource strain: Not on file  . Food insecurity    Worry: Not on file    Inability: Not on file  . Transportation needs    Medical: Not on file    Non-medical: Not on file  Tobacco Use  . Smoking status: Never Smoker  . Smokeless tobacco: Never Used  Substance and Sexual Activity  . Alcohol use: Yes    Comment: Ocassional  . Drug use: Never  . Sexual activity: Not on file  Lifestyle  . Physical activity    Days per week: Not on file    Minutes per session: Not on file  . Stress:  Not on file  Relationships  . Social Herbalist on phone: Not on file    Gets together: Not on file    Attends religious service: Not on file    Active member of club or organization: Not on file    Attends meetings of clubs or organizations: Not on file    Relationship status: Not on file  . Intimate partner violence    Fear of current or ex partner: Not on file    Emotionally abused: Not on file    Physically abused: Not on file    Forced sexual activity: Not on file  Other Topics Concern  . Not on file  Social History Narrative  . Not on file    Past Surgical Hx:  Past Surgical History:  Procedure Laterality Date  . ANKLE FUSION Left 2003   retained hardware  . CARDIOVASCULAR STRESS TEST  11-14-2016   dr berry   normal nuclear study w/ no ischemia/  normal LV function and wall motion, nuclear stress ef 50%  . CERVICAL CONIZATION W/BX N/A 11/18/2017   Procedure: COLD KNIFE CONIZATION OF THE CERVIX WITH ENDOCERVICAL CURETTAGE;  Surgeon: [name], MD;  Location: [place];  Service: Gynecology;  Laterality: N/A;  . CESAREAN SECTION WITH BILATERAL TUBAL LIGATION  04-18-2001     dr Raphael Gibney  [facility]  . INGUINAL HERNIA REPAIR Left 1980  . KNEE SURGERY Bilateral x7 left , last one 2006:  x2 right , last one 1998   per pt involving muscle, tendon, ligament repair's  . LAPAROSCOPIC CHOLECYSTECTOMY  2000  . ROTATOR CUFF REPAIR Right 2004  . TRANSTHORACIC ECHOCARDIOGRAM  11-18-2016   dr berry   ef 55-60%,  grade 2 diastolic dysfunction/  trivial PR    Past Medical Hx:  Past Medical History:  Diagnosis Date  . ADHD   . Allergic rhinitis, seasonal   . Arthritis    knees, ankles  . Arthritis   . Cervical dysplasia   . Chronic back pain   . Degenerative disc disease, cervical    neck  . Dental root cyst 2019  . GERD (gastroesophageal reflux disease)   . History of chest pain     evaluation by cardiologist, dr Gwenlyn Found, 11-08-2016 note in epic--   normal nuclear stress test and echo  . Hypertension   . Iron deficiency anemia   . Mixed hyperlipidemia   . Neuropathy   . PONV (postoperative nausea and vomiting)   . Right nephrolithiasis 08/2016  . Type 2 diabetes mellitus ([place])    followed by pcp  . Wears glasses     Oncology Hx:  Oncology History   No history exists.    Family Hx:  Family History  Problem Relation Age of Onset  . Throat cancer Father   . Hypertension Brother   .  Throat cancer Paternal Aunt   . Throat cancer Cousin     Vitals:  There were no vitals taken for this visit.  Physical Exam:   Assessment/Plan:   Devontay Celaya A., MD 11/24/2018, 2:55 PM

## 2018-12-01 ENCOUNTER — Inpatient Hospital Stay: Payer: BLUE CROSS/BLUE SHIELD | Attending: Gynecologic Oncology | Admitting: Gynecologic Oncology

## 2020-07-31 ENCOUNTER — Telehealth: Payer: Self-pay

## 2020-07-31 NOTE — Telephone Encounter (Addendum)
Miranda Shawna Lee was calling to make an appointment for a pap smear and breast exam. She was last seen 04-15-2018 by Dr. Gerarda Fraction. Reviewed office note from 04-15-2018 with Joylene John, NP.   Melissa said that Miranda Trembley could see Dr. Simona Huh for pap smear and breast exam.  Gave this information to Miranda Puffenbarger along with Dr. Andy Gauss office number [phone].  Pt verbalized understanding and will call Dr. Charmian Muff now to make an appointment.
